# Patient Record
Sex: Male | Born: 1960 | Race: White | Hispanic: No | Marital: Married | Smoking: Never smoker
Health system: Southern US, Community
[De-identification: ages and names within clinical notes are randomized; demographics above are authoritative.]

## PROBLEM LIST (undated history)

## (undated) DIAGNOSIS — I10 Essential (primary) hypertension: Secondary | ICD-10-CM

## (undated) DIAGNOSIS — J189 Pneumonia, unspecified organism: Secondary | ICD-10-CM

## (undated) DIAGNOSIS — C44310 Basal cell carcinoma of skin of unspecified parts of face: Secondary | ICD-10-CM

## (undated) DIAGNOSIS — E785 Hyperlipidemia, unspecified: Secondary | ICD-10-CM

## (undated) DIAGNOSIS — K4090 Unilateral inguinal hernia, without obstruction or gangrene, not specified as recurrent: Secondary | ICD-10-CM

## (undated) DIAGNOSIS — C801 Malignant (primary) neoplasm, unspecified: Secondary | ICD-10-CM

## (undated) DIAGNOSIS — M21379 Foot drop, unspecified foot: Secondary | ICD-10-CM

## (undated) DIAGNOSIS — M1990 Unspecified osteoarthritis, unspecified site: Secondary | ICD-10-CM

## (undated) DIAGNOSIS — K219 Gastro-esophageal reflux disease without esophagitis: Secondary | ICD-10-CM

## (undated) HISTORY — PX: BACK SURGERY: SHX140

---

## 2001-03-05 ENCOUNTER — Emergency Department (HOSPITAL_COMMUNITY): Admission: EM | Admit: 2001-03-05 | Discharge: 2001-03-05 | Payer: Self-pay | Admitting: Emergency Medicine

## 2001-03-13 ENCOUNTER — Encounter: Payer: Self-pay | Admitting: Neurosurgery

## 2001-03-14 ENCOUNTER — Inpatient Hospital Stay (HOSPITAL_COMMUNITY): Admission: RE | Admit: 2001-03-14 | Discharge: 2001-03-16 | Payer: Self-pay | Admitting: Neurosurgery

## 2001-03-14 ENCOUNTER — Encounter: Payer: Self-pay | Admitting: Neurosurgery

## 2004-08-16 HISTORY — PX: OTHER SURGICAL HISTORY: SHX169

## 2006-06-06 ENCOUNTER — Ambulatory Visit: Payer: Self-pay | Admitting: Internal Medicine

## 2006-08-13 ENCOUNTER — Encounter: Admission: RE | Admit: 2006-08-13 | Discharge: 2006-08-13 | Payer: Self-pay | Admitting: Neurosurgery

## 2006-09-13 ENCOUNTER — Ambulatory Visit (HOSPITAL_COMMUNITY): Admission: RE | Admit: 2006-09-13 | Discharge: 2006-09-14 | Payer: Self-pay | Admitting: Neurosurgery

## 2006-11-11 ENCOUNTER — Ambulatory Visit (HOSPITAL_COMMUNITY): Admission: RE | Admit: 2006-11-11 | Discharge: 2006-11-11 | Payer: Self-pay | Admitting: Neurosurgery

## 2010-12-18 ENCOUNTER — Ambulatory Visit: Payer: Self-pay | Admitting: Internal Medicine

## 2011-08-13 ENCOUNTER — Ambulatory Visit: Payer: Self-pay | Admitting: Internal Medicine

## 2014-10-21 ENCOUNTER — Ambulatory Visit: Payer: Self-pay | Admitting: Registered Nurse

## 2015-04-03 ENCOUNTER — Encounter: Payer: Self-pay | Admitting: *Deleted

## 2015-04-04 ENCOUNTER — Encounter: Payer: Self-pay | Admitting: Anesthesiology

## 2015-04-04 ENCOUNTER — Ambulatory Visit
Admission: RE | Admit: 2015-04-04 | Discharge: 2015-04-04 | Disposition: A | Discharge status: Home / Self Care | Payer: Managed Care, Other (non HMO) | Source: Ambulatory Visit | Attending: Unknown Physician Specialty | Admitting: Unknown Physician Specialty

## 2015-04-04 ENCOUNTER — Ambulatory Visit: Payer: Managed Care, Other (non HMO) | Admitting: Anesthesiology

## 2015-04-04 ENCOUNTER — Encounter
Admission: RE | Disposition: A | Discharge status: Home / Self Care | Payer: Self-pay | Source: Ambulatory Visit | Attending: Unknown Physician Specialty

## 2015-04-04 DIAGNOSIS — K640 First degree hemorrhoids: Secondary | ICD-10-CM | POA: Insufficient documentation

## 2015-04-04 DIAGNOSIS — Z981 Arthrodesis status: Secondary | ICD-10-CM | POA: Insufficient documentation

## 2015-04-04 DIAGNOSIS — Z79899 Other long term (current) drug therapy: Secondary | ICD-10-CM | POA: Insufficient documentation

## 2015-04-04 DIAGNOSIS — D125 Benign neoplasm of sigmoid colon: Secondary | ICD-10-CM | POA: Insufficient documentation

## 2015-04-04 DIAGNOSIS — D128 Benign neoplasm of rectum: Secondary | ICD-10-CM | POA: Diagnosis not present

## 2015-04-04 DIAGNOSIS — Z1211 Encounter for screening for malignant neoplasm of colon: Secondary | ICD-10-CM | POA: Insufficient documentation

## 2015-04-04 DIAGNOSIS — D122 Benign neoplasm of ascending colon: Secondary | ICD-10-CM | POA: Diagnosis not present

## 2015-04-04 DIAGNOSIS — E785 Hyperlipidemia, unspecified: Secondary | ICD-10-CM | POA: Insufficient documentation

## 2015-04-04 DIAGNOSIS — M21371 Foot drop, right foot: Secondary | ICD-10-CM | POA: Insufficient documentation

## 2015-04-04 HISTORY — PX: COLONOSCOPY WITH PROPOFOL: SHX5780

## 2015-04-04 HISTORY — DX: Foot drop, unspecified foot: M21.379

## 2015-04-04 HISTORY — DX: Hyperlipidemia, unspecified: E78.5

## 2015-04-04 SURGERY — COLONOSCOPY WITH PROPOFOL
Anesthesia: General

## 2015-04-04 MED ORDER — PROPOFOL INFUSION 10 MG/ML OPTIME
INTRAVENOUS | Status: DC | PRN
  Administered 2015-04-04: 120 ug/kg/min via INTRAVENOUS

## 2015-04-04 MED ORDER — PROPOFOL 10 MG/ML IV BOLUS
INTRAVENOUS | Status: DC | PRN
  Administered 2015-04-04: 120 mg via INTRAVENOUS

## 2015-04-04 MED ORDER — GLYCOPYRROLATE 0.2 MG/ML IJ SOLN
INTRAMUSCULAR | Status: DC | PRN
  Administered 2015-04-04: 0.2 mg via INTRAVENOUS

## 2015-04-04 MED ORDER — SODIUM CHLORIDE 0.9 % IV SOLN
INTRAVENOUS | Status: DC
  Administered 2015-04-04: 1000 mL via INTRAVENOUS

## 2015-04-04 MED ORDER — LIDOCAINE HCL (CARDIAC) 20 MG/ML IV SOLN
INTRAVENOUS | Status: DC | PRN
  Administered 2015-04-04: 20 mg via INTRAVENOUS

## 2015-04-04 MED ORDER — FENTANYL CITRATE (PF) 100 MCG/2ML IJ SOLN
INTRAMUSCULAR | Status: DC | PRN
Start: 2015-04-04 — End: 2015-04-04
  Administered 2015-04-04: 50 ug via INTRAVENOUS

## 2015-04-04 MED ORDER — PHENYLEPHRINE HCL 10 MG/ML IJ SOLN
INTRAMUSCULAR | Status: DC | PRN
  Administered 2015-04-04: 50 ug via INTRAVENOUS

## 2015-04-04 MED ORDER — EPHEDRINE SULFATE 50 MG/ML IJ SOLN
INTRAMUSCULAR | Status: DC | PRN
  Administered 2015-04-04: 10 mg via INTRAVENOUS

## 2015-04-04 NOTE — Anesthesia Postprocedure Evaluation (Signed)
  Anesthesia Post-op Note  Patient: Trevor Neal  Procedure(s) Performed: Procedure(s): COLONOSCOPY WITH PROPOFOL (N/A)  Anesthesia type:General  Patient location: PACU  Post pain: Pain level controlled  Post assessment: Post-op Vital signs reviewed, Patient's Cardiovascular Status Stable, Respiratory Function Stable, Patent Airway and No signs of Nausea or vomiting  Post vital signs: Reviewed and stable  Last Vitals:  Filed Vitals:   04/04/15 1722  BP: 138/93  Pulse: 71  Temp:   Resp: 21    Level of consciousness: awake, alert  and patient cooperative  Complications: No apparent anesthesia complications

## 2015-04-04 NOTE — Anesthesia Preprocedure Evaluation (Signed)
Anesthesia Evaluation  Patient identified by MRN, date of birth, ID band Patient awake    Reviewed: Allergy & Precautions, H&P , NPO status , Patient's Chart, lab work & pertinent test results, reviewed documented beta blocker date and time   History of Anesthesia Complications Negative for: history of anesthetic complications  Airway Mallampati: II  TM Distance: >3 FB Neck ROM: full    Dental no notable dental hx. (+) Teeth Intact, Caps, Poor Dentition   Pulmonary neg pulmonary ROS,  breath sounds clear to auscultation  Pulmonary exam normal       Cardiovascular Exercise Tolerance: Good negative cardio ROS Normal cardiovascular examRhythm:regular Rate:Normal     Neuro/Psych negative neurological ROS  negative psych ROS   GI/Hepatic negative GI ROS, Neg liver ROS,   Endo/Other  negative endocrine ROS  Renal/GU negative Renal ROS  negative genitourinary   Musculoskeletal   Abdominal   Peds  Hematology negative hematology ROS (+)   Anesthesia Other Findings Past Medical History:   Foot drop                                                      Comment:chronic   Hyperlipidemia                                               Reproductive/Obstetrics negative OB ROS                             Anesthesia Physical Anesthesia Plan  ASA: I  Anesthesia Plan: General   Post-op Pain Management:    Induction:   Airway Management Planned:   Additional Equipment:   Intra-op Plan:   Post-operative Plan:   Informed Consent: I have reviewed the patients History and Physical, chart, labs and discussed the procedure including the risks, benefits and alternatives for the proposed anesthesia with the patient or authorized representative who has indicated his/her understanding and acceptance.   Dental Advisory Given  Plan Discussed with: Anesthesiologist, CRNA and Surgeon  Anesthesia Plan  Comments:         Anesthesia Quick Evaluation

## 2015-04-04 NOTE — Op Note (Signed)
[facility] Gastroenterology Patient Name: Trevor Neal Procedure Date: 04/04/2015 4:01 PM MRN: [id number] Account #: [id number] Date of Birth: Jul 25, 1961 Admit Type: Outpatient Age: 54 Room: [facility] ENDO ROOM 1 Gender: Male Note Status: Finalized Procedure:         Colonoscopy Indications:       Screening for colorectal malignant neoplasm Providers:         Manya Silvas, MD Referring MD:      Rusty Aus, MD (Referring MD) Medicines:         Propofol per Anesthesia Complications:     No immediate complications. Procedure:         Pre-Anesthesia Assessment:                    - After reviewing the risks and benefits, the patient was                     deemed in satisfactory condition to undergo the procedure.                    After obtaining informed consent, the colonoscope was                     passed under direct vision. Throughout the procedure, the                     patient's blood pressure, pulse, and oxygen saturations                     were monitored continuously. The Olympus PCF-H180AL                     colonoscope ( S#: [id number] ) was introduced through the                     anus and advanced to the the cecum, identified by                     appendiceal orifice and ileocecal valve. The colonoscopy                     was performed without difficulty. The patient tolerated                     the procedure well. The quality of the bowel preparation                     was excellent. Findings:      Three sessile polyps were found in the rectum, in the sigmoid colon and       in the ascending colon. The polyps were diminutive in size. These polyps       were removed with a jumbo cold forceps. Resection and retrieval were       complete.      Internal hemorrhoids were found during endoscopy. The hemorrhoids were       small and Grade I (internal hemorrhoids that do not prolapse). Impression:        - Three diminutive polyps in the  rectum, in the sigmoid                     colon and in the ascending colon. Resected and retrieved.                    -  Internal hemorrhoids. Recommendation:    - Await pathology results. Manya Silvas, MD 04/04/2015 4:47:22 PM This report has been signed electronically. Number of Addenda: 0 Note Initiated On: 04/04/2015 4:01 PM Scope Withdrawal Time: 0 hours 17 minutes 41 seconds  Total Procedure Duration: 0 hours 23 minutes 47 seconds       [facility]

## 2015-04-04 NOTE — Transfer of Care (Signed)
Immediate Anesthesia Transfer of Care Note  Patient: Trevor Neal  Procedure(s) Performed: Procedure(s): COLONOSCOPY WITH PROPOFOL (N/A)  Patient Location: PACU  Anesthesia Type:General  Level of Consciousness: sedated  Airway & Oxygen Therapy: Patient Spontanous Breathing and Patient connected to nasal cannula oxygen  Post-op Assessment: Report given to RN and Post -op Vital signs reviewed and stable  Post vital signs: Reviewed and stable  Last Vitals:  Filed Vitals:   04/04/15 1510  BP: 135/79  Pulse: 62  Temp: 36.4 C  Resp: 22    Complications: No apparent anesthesia complications

## 2015-04-05 NOTE — Progress Notes (Signed)
Non-identifying voicemail.  No message left.

## 2015-04-07 ENCOUNTER — Encounter: Payer: Self-pay | Admitting: Unknown Physician Specialty

## 2015-04-08 LAB — SURGICAL PATHOLOGY

## 2016-01-16 ENCOUNTER — Other Ambulatory Visit: Payer: Self-pay | Admitting: Internal Medicine

## 2016-01-16 DIAGNOSIS — I6523 Occlusion and stenosis of bilateral carotid arteries: Secondary | ICD-10-CM

## 2016-01-22 ENCOUNTER — Ambulatory Visit
Admission: RE | Admit: 2016-01-22 | Discharge: 2016-01-22 | Disposition: A | Discharge status: Home / Self Care | Payer: Managed Care, Other (non HMO) | Source: Ambulatory Visit | Attending: Internal Medicine | Admitting: Internal Medicine

## 2016-01-22 DIAGNOSIS — I6523 Occlusion and stenosis of bilateral carotid arteries: Secondary | ICD-10-CM | POA: Insufficient documentation

## 2016-03-11 ENCOUNTER — Encounter
Admission: RE | Admit: 2016-03-11 | Discharge: 2016-03-11 | Disposition: A | Discharge status: Home / Self Care | Payer: Managed Care, Other (non HMO) | Source: Ambulatory Visit | Attending: Surgery | Admitting: Surgery

## 2016-03-11 HISTORY — DX: Malignant (primary) neoplasm, unspecified: C80.1

## 2016-03-11 HISTORY — DX: Foot drop, unspecified foot: M21.379

## 2016-03-11 HISTORY — DX: Essential (primary) hypertension: I10

## 2016-03-11 NOTE — Patient Instructions (Signed)
  Your procedure is scheduled on: 03-19-16 (FRIDAY) Report to Same Day Surgery 2nd floor medical mall To find out your arrival time please call [phone] between [place] on 03-18-16 (THURSDAY)  Remember: Instructions that are not followed completely may result in serious medical risk, up to and including death, or upon the discretion of your surgeon and anesthesiologist your surgery may need to be rescheduled.    _x___ 1. Do not eat food or drink liquids after midnight. No gum chewing or hard candies.     __x__ 2. No Alcohol for 24 hours before or after surgery.   __x__3. No Smoking for 24 prior to surgery.   ____  4. Bring all medications with you on the day of surgery if instructed.    __x__ 5. Notify your doctor if there is any change in your medical condition     (cold, fever, infections).     Do not wear jewelry, make-up, hairpins, clips or nail polish.  Do not wear lotions, powders, or perfumes. You may wear deodorant.  Do not shave 48 hours prior to surgery. Men may shave face and neck.  Do not bring valuables to the hospital.    [facility] is not responsible for any belongings or valuables.               Contacts, dentures or bridgework may not be worn into surgery.  Leave your suitcase in the car. After surgery it may be brought to your room.  For patients admitted to the hospital, discharge time is determined by your treatment team.   Patients discharged the day of surgery will not be allowed to drive home.    Please read over the following fact sheets that you were given:   [facility] Preparing for Surgery and or MRSA Information   ____ Take these medicines the morning of surgery with A SIP OF WATER:    1. NONE  2.  3.  4.  5.  6.  ____ Fleet Enema (as directed)   _x___ Use CHG Soap or sage wipes as directed on instruction sheet   ____ Use inhalers on the day of surgery and bring to hospital day of surgery  ____ Stop metformin 2 days prior to  surgery    ____ Take 1/2 of usual insulin dose the night before surgery and none on the morning of surgery.   _X___ Stop aspirin or coumadin, or plavix-STOP ASPRIN NOW  _x__ Stop Anti-inflammatories such as Advil, Aleve, Ibuprofen, Motrin, Naproxen,          Naprosyn, Goodies powders or aspirin products. Ok to take Tylenol.   ____ Stop supplements until after surgery.    ____ Bring C-Pap to the hospital.

## 2016-03-16 NOTE — Pre-Procedure Instructions (Signed)
NM myocardial perfusion SPECT multiple (stress and rest)01/30/2016 [place] Result Impression  Negative ETT with no arrhythmia or ischemia.LV function normal.Low  probability for ischemia.  Result Narrative  CARDIOLOGY DEPARTMENT [facility] [address] [place], J989805  Procedure: Exercise Myocardial Perfusion Imaging ONE day procedure  Indication: Chest pain at rest, unspecified Plan: NM myocardial perfusion SPECT multiple (stress  and rest), ECG stress test only  Ordering Physician:   Dr. Bartholome Bill   Clinical History: 55 y.o. year old male Vitals: Height: 72 inWeight: 216 lb Cardiac risk factors include:  CAS, Hyperlipidemia, HTN and Family Hx CAD    Procedure: The patient performed treadmill exercise using a Bruce protocol for 10:00  minutes. The exercise test was stopped due to fatigue.Blood pressure  response was normal.   Rest HR: 61bpm Rest BP: 136/35mmHg Max HR: 148bpm Max BP: 198/32mmHg Mets:13.40 % MAX HR: 88%  Stress Test Administered by: Oswald Hillock, CMA  ECG Interpretation: Rest [id number] sinus rhythm, none Stress [id number] tachycardia, nonspecific ST-T wave changes Recovery [id number] sinus rhythm ECG Interpretation:negative, nondiagnostic changes.   Administrations This Visit  technetium Tc37m sestamibi (CARDIOLITE) injection [id number] millicurie  Admin Date Action Dose Route Administered By      [id number] Given [id number] millicurie Intravenous Scott N Goard, CNMT      technetium Tc84m sestamibi (CARDIOLITE) injection [id number] millicurie  Admin Date Action Dose Route Administered By      [id number] Given [id number] millicurie Intravenous Ane Payment, CNMT        Gated post-stress perfusion imaging was performed 30 minutes after stress.  Rest images were performed 30 minutes after injection.  Gated LV  Analysis:  TID:0.96  LVEF= 61%  FINDINGS: Regional wall motion:reveals normal myocardial thickening and wall  motion. The overall quality of the study is good. Artifacts noted: no Left ventricular cavity: normal.  Perfusion Analysis:SPECT images demonstrate homogeneous tracer  distribution throughout the myocardium.

## 2016-03-18 ENCOUNTER — Encounter
Admission: RE | Admit: 2016-03-18 | Discharge: 2016-03-18 | Disposition: A | Discharge status: Home / Self Care | Payer: Managed Care, Other (non HMO) | Source: Ambulatory Visit | Attending: Surgery | Admitting: Surgery

## 2016-03-18 DIAGNOSIS — I10 Essential (primary) hypertension: Secondary | ICD-10-CM | POA: Diagnosis not present

## 2016-03-18 DIAGNOSIS — M21379 Foot drop, unspecified foot: Secondary | ICD-10-CM | POA: Diagnosis not present

## 2016-03-18 DIAGNOSIS — Z981 Arthrodesis status: Secondary | ICD-10-CM | POA: Diagnosis not present

## 2016-03-18 DIAGNOSIS — E785 Hyperlipidemia, unspecified: Secondary | ICD-10-CM | POA: Diagnosis not present

## 2016-03-18 DIAGNOSIS — Z7982 Long term (current) use of aspirin: Secondary | ICD-10-CM | POA: Diagnosis not present

## 2016-03-18 DIAGNOSIS — Z8249 Family history of ischemic heart disease and other diseases of the circulatory system: Secondary | ICD-10-CM | POA: Diagnosis not present

## 2016-03-18 DIAGNOSIS — Z79899 Other long term (current) drug therapy: Secondary | ICD-10-CM | POA: Diagnosis not present

## 2016-03-18 DIAGNOSIS — K439 Ventral hernia without obstruction or gangrene: Secondary | ICD-10-CM | POA: Diagnosis not present

## 2016-03-18 LAB — POTASSIUM: POTASSIUM: 3.8 mmol/L (ref 3.5–5.1)

## 2016-03-18 NOTE — Pre-Procedure Instructions (Signed)
Telephone Encounter - Nathanial Millman, RN - 01/23/2016 10:30 AM EDT I spoke with patient, notified him of carotid dopper results, he states understanding of info, will follow this annually and he will continue taking his crestor.   Back to top of Miscellaneous Notes Telephone Encounter - Nathanial Millman, RN - 01/23/2016 8:50 AM EDT [facility] for patient.   Back to top of Miscellaneous Notes Telephone Encounter - Yevonne Pax, MD - 01/22/2016 6:03 PM EDT Carotid Doppler shows mild to moderate plaque in the right carotid, less than 50% stenosis, not affecting his symptoms but certainly shows that he needs the cholesterol medication, will follow annually

## 2016-03-18 NOTE — Pre-Procedure Instructions (Signed)
Trevor Levans, MD - 01/28/2016 3:45 PM EDT Formatting of this note may be different from the original.   Chief Complaint: Chief Complaint  Patient presents with  . Establish Care  per klein abnormal ETT  Date of Service: 01/28/2016 Date of Birth: Mar 05, 1961 PCP: Rusty Aus, MD  History of Present Illness: Trevor Neal is a 55 y.o.male patient who presents as a urgent visit after undergoing an ETT which was felt to be abnormal. Patient has a history of hypertension and hyperlipidemia. He has occasional chest discomfort. He is on Crestor for hyperlipidemia and is on enteric-coated aspirin daily. He has occasional umbilical pain which causes him some nausea. He also has some dizziness and lightheadedness with activity. He underwent an ETT which was felt to show ischemia. He was referred to our office for evaluation. Patient has only minimal episodes of chest pain. He remains quite active without difficulty. Risk factors include hyperlipidemia.  Past Medical and Surgical History  Past Medical History Past Medical History:  Diagnosis Date  . Essential hypertension 04/17/2015  . Footdrop  chronic  . Hyperlipidemia, unspecified   Past Surgical History He has a past surgical history that includes Spine surgery; Spine surgery (2008); Spinal fusion (2012); foot drop (2006); arthrodesis anterior cervicle spine (N/A, 06/22/2013); arthrodesis anterior cervicle spine (N/A, 06/22/2013); instrumentation anterior spine 2 to 3 vertebral segments (N/A, 06/22/2013); insertion structural bone allograft for spine surgery (N/A, 06/22/2013); Colonoscopy (04/04/2015); Back surgery; and Vasectomy.   Medications and Allergies  Current Medications  Current Outpatient Prescriptions  Medication Sig Dispense Refill  . fluticasone (FLONASE) 50 mcg/actuation nasal spray Place 2 sprays into both nostrils once daily as needed for Rhinitis.  . rosuvastatin (CRESTOR) 10 MG tablet Take 5 mg by mouth once daily.  Marland Kitchen  aspirin 81 MG EC tablet Take 1 tablet (81 mg total) by mouth once daily. 30 tablet 11   No current facility-administered medications for this visit.   Allergies: Review of patient's allergies indicates no known allergies.  Social and Family History  Social History reports that he has never smoked. He has never used smokeless tobacco. He reports that he drinks about 3.6 oz of alcohol per week He reports that he does not use illicit drugs.  Family History Family History  Problem Relation Age of Onset  . Heart attack Father  . Heart attack Other  Uncle   Review of Systems  Review of Systems  Constitutional: Negative for chills, diaphoresis, fever, malaise/fatigue and weight loss.  HENT: Negative for congestion, ear discharge, hearing loss and tinnitus.  Eyes: Negative for blurred vision.  Respiratory: Negative for cough, hemoptysis, sputum production, shortness of breath and wheezing.  Cardiovascular: Positive for chest pain. Negative for palpitations, orthopnea, claudication, leg swelling and PND.  Gastrointestinal: Negative for abdominal pain, blood in stool, constipation, diarrhea, heartburn, melena, nausea and vomiting.  Genitourinary: Negative for dysuria, frequency, hematuria and urgency.  Musculoskeletal: Negative for back pain, falls, joint pain and myalgias.  Skin: Negative for itching and rash.  Neurological: Positive for dizziness. Negative for tingling, focal weakness, loss of consciousness, weakness and headaches.  Endo/Heme/Allergies: Negative for polydipsia. Does not bruise/bleed easily.  Psychiatric/Behavioral: Negative for depression, memory loss and substance abuse. The patient is not nervous/anxious.   Physical Examination   Vitals:BP 130/72  Pulse 84  Resp 12  Ht 182.9 cm (6')  Wt 98 kg (216 lb)  BMI 29.29 kg/m2 Ht:182.9 cm (6') Wt:98 kg (216 lb) [id number] surface area is 2.23 meters squared. Body mass  index is 29.29 kg/(m^2).  Wt Readings from Last 3  Encounters:  01/28/16 98 kg (216 lb)  01/16/16 99.3 kg (219 lb)  08/05/15 96.2 kg (212 lb)   BP Readings from Last 3 Encounters:  01/28/16 130/72  01/16/16 148/82  08/05/15 131/81   General appearance appears in no acute distress  Head Mouth and Eye exam Normocephalic, without obvious abnormality, atraumatic Dentition is good Eyes appear anicteric   Neck exam Thyroid: normal  Nodes: no obvious adenopathy  LUNGS Breath Sounds: Normal Percussion: Normal  CARDIOVASCULAR JVP CV wave: no HJR: no Elevation at 90 degrees: None Carotid Pulse: normal pulsation bilaterally Bruit: None Apex: apical impulse normal  Auscultation Rhythm: normal sinus rhythm S1: normal S2: normal Clicks: no Rub: no Murmurs: no murmurs  Gallop: None ABDOMEN Liver enlargement: no Pulsatile aorta: no Ascites: no Bruits: no  EXTREMITIES Clubbing: no Edema: trace to 1+ bilateral pedal edema Pulses: peripheral pulses symmetrical Femoral Bruits: no Amputation: no SKIN Rash: no Cyanosis: no Embolic phemonenon: no Bruising: no NEURO Alert and Oriented to person, place and time: yes Non focal: yes  PSYCH: Pt appears to have normal affect  LABS REVIEWED Last 3 CBC results: Lab Results  Component Value Date  WBC 8.2 01/15/2016  WBC 7.3 01/29/2015  WBC 6.7 06/19/2013   Lab Results  Component Value Date  HGB 14.2 01/15/2016  HGB 14.7 01/29/2015  HGB 14.9 06/19/2013   Lab Results  Component Value Date  HCT 42.0 01/15/2016  HCT 43.3 01/29/2015  HCT 0.43 06/19/2013   Lab Results  Component Value Date  PLT 245 01/15/2016  PLT 267 01/29/2015  PLT 260 06/19/2013   Lab Results  Component Value Date  CREATININE 1.1 01/15/2016  BUN 19 01/15/2016  NA 141 01/15/2016  K 4.0 01/15/2016  CL 106 01/15/2016  CO2 27.1 01/15/2016   No results found for: HGBA1C  Lab Results  Component Value Date  HDL 30.9 01/15/2016  HDL 35.0 01/29/2015   Lab Results  Component Value  Date  LDLCALC 56 01/15/2016  LDLCALC 75 01/29/2015   Lab Results  Component Value Date  TRIG 231 (H) 01/15/2016  TRIG 123 01/29/2015   Lab Results  Component Value Date  ALT 29 01/15/2016  AST 19 01/15/2016  ALKPHOS 75 01/15/2016   Lab Results  Component Value Date  TSH 1.647 01/15/2016   Diagnostic Studies Reviewed:  EKG EKG demonstrated normal sinus rhythm, nonspecific ST and T waves changes.  Assessment and Plan   55 y.o. male with  ICD-10-CM ICD-9-CM  1. Chest pain at rest, unspecified-etiology unclear. ETT was not significantly abnormal somewhat equivocal. We will proceed with an ETT sestamibi to evaluate for evidence of possible ischemia. Will continue with current regimen until this is complete R07.9 786.50 NM myocardial perfusion SPECT multiple (stress and rest)  ECG stress test only  2. Carotid stenosis, asymptomatic, right I65.21 433.10  3. Essential hypertension I10 401.9  4. Hyperlipidemia, mixed E78.2 272.2   Return in about 2 weeks (around 02/11/2016).  These notes generated with voice recognition software. I apologize for typographical errors.  Trevor Levans, MD       Plan of Treatment - as of this encounter  Upcoming Encounters Upcoming Encounters  Date Type Specialty Care Team Description  04/01/2016 Post Op General Surgery Delrae Alfred., MD  [place]  [place], [place] [address]  [phone]  [phone] (Fax)    01/17/2017 Office Visit Internal Medicine Yevonne Pax, MD  [place]  [facility] [place]  [place],  [address]  [phone]  [phone] (Fax)

## 2016-03-19 ENCOUNTER — Ambulatory Visit: Payer: Managed Care, Other (non HMO) | Admitting: Certified Registered"

## 2016-03-19 ENCOUNTER — Encounter: Payer: Self-pay | Admitting: *Deleted

## 2016-03-19 ENCOUNTER — Ambulatory Visit
Admission: RE | Admit: 2016-03-19 | Discharge: 2016-03-19 | Disposition: A | Discharge status: Home / Self Care | Payer: Managed Care, Other (non HMO) | Source: Ambulatory Visit | Attending: Surgery | Admitting: Surgery

## 2016-03-19 ENCOUNTER — Encounter
Admission: RE | Disposition: A | Discharge status: Home / Self Care | Payer: Self-pay | Source: Ambulatory Visit | Attending: Surgery

## 2016-03-19 DIAGNOSIS — Z7982 Long term (current) use of aspirin: Secondary | ICD-10-CM | POA: Insufficient documentation

## 2016-03-19 DIAGNOSIS — E785 Hyperlipidemia, unspecified: Secondary | ICD-10-CM | POA: Insufficient documentation

## 2016-03-19 DIAGNOSIS — M21379 Foot drop, unspecified foot: Secondary | ICD-10-CM | POA: Insufficient documentation

## 2016-03-19 DIAGNOSIS — Z981 Arthrodesis status: Secondary | ICD-10-CM | POA: Insufficient documentation

## 2016-03-19 DIAGNOSIS — Z79899 Other long term (current) drug therapy: Secondary | ICD-10-CM | POA: Insufficient documentation

## 2016-03-19 DIAGNOSIS — Z8249 Family history of ischemic heart disease and other diseases of the circulatory system: Secondary | ICD-10-CM | POA: Insufficient documentation

## 2016-03-19 DIAGNOSIS — K439 Ventral hernia without obstruction or gangrene: Secondary | ICD-10-CM | POA: Insufficient documentation

## 2016-03-19 DIAGNOSIS — I10 Essential (primary) hypertension: Secondary | ICD-10-CM | POA: Insufficient documentation

## 2016-03-19 HISTORY — PX: VENTRAL HERNIA REPAIR: SHX424

## 2016-03-19 SURGERY — REPAIR, HERNIA, VENTRAL
Anesthesia: General | Wound class: Clean

## 2016-03-19 MED ORDER — BUPIVACAINE-EPINEPHRINE (PF) 0.5% -1:200000 IJ SOLN
INTRAMUSCULAR | Status: DC | PRN
  Administered 2016-03-19: 10 mL via PERINEURAL

## 2016-03-19 MED ORDER — FENTANYL CITRATE (PF) 100 MCG/2ML IJ SOLN
INTRAMUSCULAR | Status: DC | PRN
  Administered 2016-03-19: 100 ug via INTRAVENOUS

## 2016-03-19 MED ORDER — HYDROCODONE-ACETAMINOPHEN 5-325 MG PO TABS
1.0000 | ORAL_TABLET | ORAL | Status: DC | PRN
  Administered 2016-03-19: 1 via ORAL

## 2016-03-19 MED ORDER — BUPIVACAINE-EPINEPHRINE (PF) 0.5% -1:200000 IJ SOLN
INTRAMUSCULAR | Status: AC
  Filled 2016-03-19: qty 30

## 2016-03-19 MED ORDER — CEFAZOLIN SODIUM-DEXTROSE 2-4 GM/100ML-% IV SOLN
INTRAVENOUS | Status: AC
  Administered 2016-03-19: 2 g via INTRAVENOUS
  Filled 2016-03-19: qty 100

## 2016-03-19 MED ORDER — HYDROCODONE-ACETAMINOPHEN 5-325 MG PO TABS
ORAL_TABLET | ORAL | Status: AC
  Administered 2016-03-19: 1 via ORAL
  Filled 2016-03-19: qty 1

## 2016-03-19 MED ORDER — CEFAZOLIN SODIUM-DEXTROSE 2-4 GM/100ML-% IV SOLN
2.0000 g | Freq: Once | INTRAVENOUS | Status: AC
  Administered 2016-03-19: 2 g via INTRAVENOUS

## 2016-03-19 MED ORDER — MIDAZOLAM HCL 2 MG/2ML IJ SOLN
INTRAMUSCULAR | Status: DC | PRN
  Administered 2016-03-19: 2 mg via INTRAVENOUS

## 2016-03-19 MED ORDER — ONDANSETRON HCL 4 MG/2ML IJ SOLN
INTRAMUSCULAR | Status: DC | PRN
  Administered 2016-03-19: 4 mg via INTRAVENOUS

## 2016-03-19 MED ORDER — HYDROCODONE-ACETAMINOPHEN 5-325 MG PO TABS: 1.0000 | ORAL_TABLET | ORAL | 0 refills | Status: DC | PRN

## 2016-03-19 MED ORDER — ROCURONIUM BROMIDE 100 MG/10ML IV SOLN
INTRAVENOUS | Status: DC | PRN
  Administered 2016-03-19 (×2): 10 mg via INTRAVENOUS
  Administered 2016-03-19: 50 mg via INTRAVENOUS
  Administered 2016-03-19: 10 mg via INTRAVENOUS

## 2016-03-19 MED ORDER — FAMOTIDINE 20 MG PO TABS
20.0000 mg | ORAL_TABLET | Freq: Once | ORAL | Status: AC
  Administered 2016-03-19: 20 mg via ORAL

## 2016-03-19 MED ORDER — NEOSTIGMINE METHYLSULFATE 10 MG/10ML IV SOLN
INTRAVENOUS | Status: DC | PRN
  Administered 2016-03-19: 4 mg via INTRAVENOUS

## 2016-03-19 MED ORDER — FAMOTIDINE 20 MG PO TABS
ORAL_TABLET | ORAL | Status: AC
  Administered 2016-03-19: 20 mg via ORAL
  Filled 2016-03-19: qty 1

## 2016-03-19 MED ORDER — LIDOCAINE HCL (CARDIAC) 20 MG/ML IV SOLN
INTRAVENOUS | Status: DC | PRN
  Administered 2016-03-19: 100 mg via INTRAVENOUS

## 2016-03-19 MED ORDER — LACTATED RINGERS IV SOLN: INTRAVENOUS | Status: DC

## 2016-03-19 MED ORDER — PROPOFOL 10 MG/ML IV BOLUS
INTRAVENOUS | Status: DC | PRN
  Administered 2016-03-19: 180 mg via INTRAVENOUS

## 2016-03-19 MED ORDER — GLYCOPYRROLATE 0.2 MG/ML IJ SOLN
INTRAMUSCULAR | Status: DC | PRN
  Administered 2016-03-19: .6 mg via INTRAVENOUS

## 2016-03-19 MED ORDER — LACTATED RINGERS IV SOLN
INTRAVENOUS | Status: DC | PRN
  Administered 2016-03-19: 08:00:00 via INTRAVENOUS

## 2016-03-19 MED ORDER — FENTANYL CITRATE (PF) 100 MCG/2ML IJ SOLN: 25.0000 ug | INTRAMUSCULAR | Status: DC | PRN

## 2016-03-19 MED ORDER — ONDANSETRON HCL 4 MG/2ML IJ SOLN: 4.0000 mg | Freq: Once | INTRAMUSCULAR | Status: DC | PRN

## 2016-03-19 SURGICAL SUPPLY — 26 items
BLADE CLIPPER SURG (BLADE) ×2 IMPLANT
CANISTER SUCT 1200ML W/VALVE (MISCELLANEOUS) ×3 IMPLANT
CHLORAPREP W/TINT 26ML (MISCELLANEOUS) ×3 IMPLANT
DRAPE LAPAROTOMY 100X77 ABD (DRAPES) ×3 IMPLANT
ELECT REM PT RETURN 9FT ADLT (ELECTROSURGICAL) ×3
ELECTRODE REM PT RTRN 9FT ADLT (ELECTROSURGICAL) ×1 IMPLANT
GAUZE SPONGE 4X4 12PLY STRL (GAUZE/BANDAGES/DRESSINGS) IMPLANT
GLOVE BIO SURGEON STRL SZ7.5 (GLOVE) ×9 IMPLANT
GOWN STRL REUS W/ TWL LRG LVL3 (GOWN DISPOSABLE) ×3 IMPLANT
GOWN STRL REUS W/TWL LRG LVL3 (GOWN DISPOSABLE) ×12
KIT RM TURNOVER STRD PROC AR (KITS) ×3 IMPLANT
LABEL OR SOLS (LABEL) ×3 IMPLANT
LIQUID BAND (GAUZE/BANDAGES/DRESSINGS) ×3 IMPLANT
MESH SYNTHETIC 4X6 SOFT BARD (Mesh General) IMPLANT
MESH SYNTHETIC SOFT BARD 4X6 (Mesh General) ×2 IMPLANT
NDL HYPO 25X1 1.5 SAFETY (NEEDLE) ×1 IMPLANT
NEEDLE HYPO 25X1 1.5 SAFETY (NEEDLE) ×3 IMPLANT
NS IRRIG 500ML POUR BTL (IV SOLUTION) ×3 IMPLANT
PACK BASIN MINOR ARMC (MISCELLANEOUS) ×3 IMPLANT
STAPLER SKIN PROX 35W (STAPLE) IMPLANT
SUT CHROMIC 3 0 SH 27 (SUTURE) ×3 IMPLANT
SUT MNCRL 4-0 (SUTURE) ×3
SUT MNCRL 4-0 27XMFL (SUTURE) ×1
SUT SURGILON 0 30 BLK (SUTURE) ×7 IMPLANT
SUTURE MNCRL 4-0 27XMF (SUTURE) ×1 IMPLANT
SYRINGE 10CC LL (SYRINGE) ×3 IMPLANT

## 2016-03-19 NOTE — H&P (Signed)
  He reports no change in condition since office exam.  Labs noted  Discussed plan for ventral hernia  repair

## 2016-03-19 NOTE — Anesthesia Preprocedure Evaluation (Signed)
Anesthesia Evaluation  Patient identified by MRN, date of birth, ID band Patient awake    Reviewed: Allergy & Precautions, NPO status , Patient's Chart, lab work & pertinent test results  Airway Mallampati: II       Dental  (+) Teeth Intact   Pulmonary neg pulmonary ROS,    breath sounds clear to auscultation       Cardiovascular Exercise Tolerance: Good hypertension, Pt. on medications  Rhythm:Regular     Neuro/Psych negative neurological ROS  negative psych ROS   GI/Hepatic negative GI ROS, Neg liver ROS,   Endo/Other  negative endocrine ROS  Renal/GU negative Renal ROS     Musculoskeletal   Abdominal Normal abdominal exam  (+)   Peds  Hematology   Anesthesia Other Findings   Reproductive/Obstetrics                             Anesthesia Physical Anesthesia Plan  ASA: II  Anesthesia Plan: General   Post-op Pain Management:    Induction: Intravenous  Airway Management Planned: Oral ETT  Additional Equipment:   Intra-op Plan:   Post-operative Plan: Extubation in OR  Informed Consent: I have reviewed the patients History and Physical, chart, labs and discussed the procedure including the risks, benefits and alternatives for the proposed anesthesia with the patient or authorized representative who has indicated his/her understanding and acceptance.     Plan Discussed with: CRNA  Anesthesia Plan Comments:         Anesthesia Quick Evaluation

## 2016-03-19 NOTE — Transfer of Care (Signed)
Immediate Anesthesia Transfer of Care Note  Patient: Trevor Neal  Procedure(s) Performed: Procedure(s): HERNIA REPAIR VENTRAL ADULT (N/A)  Patient Location: PACU  Anesthesia Type:General  Level of Consciousness: sedated and responds to stimulation  Airway & Oxygen Therapy: Patient Spontanous Breathing and Patient connected to face mask oxygen  Post-op Assessment: Report given to RN and Post -op Vital signs reviewed and stable  Post vital signs: Reviewed and stable  Last Vitals:  Vitals:   03/19/16 1015 03/19/16 1019  BP: (!) 168/99 (!) 168/99  Pulse: 84 77  Resp: (!) 37 15  Temp: 36.8 C     Last Pain:  Vitals:   03/19/16 0823  TempSrc: Oral         Complications: No apparent anesthesia complications

## 2016-03-19 NOTE — Anesthesia Procedure Notes (Signed)
Procedure Name: Intubation Performed by: Lance Muss Pre-anesthesia Checklist: Patient identified, Patient being monitored, Timeout performed, Emergency Drugs available and Suction available Patient Re-evaluated:Patient Re-evaluated prior to inductionOxygen Delivery Method: Circle system utilized Preoxygenation: Pre-oxygenation with 100% oxygen Intubation Type: IV induction Ventilation: Mask ventilation without difficulty and Oral airway inserted - appropriate to patient size Laryngoscope Size: Mac and 4 Grade View: Grade III Tube type: Oral Tube size: 7.5 mm Number of attempts: 1 Airway Equipment and Method: Stylet Placement Confirmation: ETT inserted through vocal cords under direct vision,  positive ETCO2 and breath sounds checked- equal and bilateral Secured at: 23 cm Tube secured with: Tape Dental Injury: Teeth and Oropharynx as per pre-operative assessment  Difficulty Due To: Difficult Airway- due to anterior larynx

## 2016-03-19 NOTE — Op Note (Signed)
OPERATIVE REPORT  PREOPERATIVE  DIAGNOSIS: . Ventral hernia  POSTOPERATIVE DIAGNOSIS: . Ventral hernia  PROCEDURE: . Ventral hernia repair  ANESTHESIA:  General  SURGEON: Rochel Brome  MD   INDICATIONS: . He reports a history of bulging and mild discomfort in the epigastrium. A ventral hernia was demonstrated on physical exam and repair was recommended for definitive treatment.  With the patient on the operating table in the supine position under general anesthesia the abdomen was clipped and prepared with ChloraPrep draped in a sterile manner. A longitudinally incision was made in the epigastrium 5-8 cm above the umbilicus and during the course of dissection was lengthened. Dissection was carried down through subcutaneous tissues. Several small bleeding points were cauterized. There was a finding of herniated properitoneal fat which was approximately 3 cm in dimension this was dissected free from surrounding structures and demonstrated the fascial ring defect. It was necessary to enlarge the fascial ring defect on the patient's left side and then the hernia could be reduced. Bard soft mesh was cut to create an oval shape of 1.5 x 2 cm in dimension and was placed into the properitoneal plane oriented longitudinally and sutured to the overlying fascia with through and through 0 Surgilon sutures. The repair was carried out with a transversely oriented suture line of interrupted 0 Surgilon figure-of-eight sutures incorporating each suture into the mesh. The subcutaneous tissues were infiltrated with half percent Sensorcaine with epinephrine. The superficial fascia was closed with 3-0 chromic. The skin was closed with running 4-0 Monocryl subcuticular suture and LiquiBand. The patient tolerated surgery satisfactorily and was prepared for transfer to the recovery room  [facility].D.

## 2016-03-19 NOTE — Anesthesia Postprocedure Evaluation (Signed)
Anesthesia Post Note  Patient: Trevor Neal  Procedure(s) Performed: Procedure(s) (LRB): HERNIA REPAIR VENTRAL ADULT (N/A)  Patient location during evaluation: PACU Anesthesia Type: General Level of consciousness: awake Pain management: pain level controlled Vital Signs Assessment: post-procedure vital signs reviewed and stable Respiratory status: spontaneous breathing Cardiovascular status: stable Anesthetic complications: no    Last Vitals:  Vitals:   03/19/16 1019 03/19/16 1020  BP: (!) 168/99 (!) 146/86  Pulse: 77 79  Resp: 15 18  Temp:      Last Pain:  Vitals:   03/19/16 1015  TempSrc:   PainSc: Asleep                 VAN STAVEREN,Darneisha Windhorst

## 2016-03-19 NOTE — Discharge Instructions (Addendum)
Take Tylenol or Norco if needed for pain.  Should not drive or do anything dangerous when taking Norco.  May resume aspirin on Sunday.  Avoid straining and heavy lifting.  May shower and blot dry.  AMBULATORY SURGERY  DISCHARGE INSTRUCTIONS   1) The drugs that you were given will stay in your system until tomorrow so for the next 24 hours you should not:  A) Drive an automobile B) Make any legal decisions C) Drink any alcoholic beverage   2) You may resume regular meals tomorrow.  Today it is better to start with liquids and gradually work up to solid foods.  You may eat anything you prefer, but it is better to start with liquids, then soup and crackers, and gradually work up to solid foods.   3) Please notify your doctor immediately if you have any unusual bleeding, trouble breathing, redness and pain at the surgery site, drainage, fever, or pain not relieved by medication.    4) Additional Instructions:        Please contact your physician with any problems or Same Day Surgery at [phone], Monday through Friday 6 am to 4 pm, or [place] at [facility] number at [phone].

## 2016-04-09 ENCOUNTER — Encounter: Payer: Self-pay | Admitting: Surgery

## 2017-02-25 ENCOUNTER — Other Ambulatory Visit: Payer: Self-pay | Admitting: Internal Medicine

## 2017-02-25 DIAGNOSIS — I6521 Occlusion and stenosis of right carotid artery: Secondary | ICD-10-CM

## 2017-03-07 ENCOUNTER — Ambulatory Visit
Admission: RE | Admit: 2017-03-07 | Discharge: 2017-03-07 | Disposition: A | Discharge status: Home / Self Care | Payer: Commercial Managed Care - PPO | Source: Ambulatory Visit | Attending: Internal Medicine | Admitting: Internal Medicine

## 2017-03-07 DIAGNOSIS — I6521 Occlusion and stenosis of right carotid artery: Secondary | ICD-10-CM | POA: Diagnosis not present

## 2017-03-07 DIAGNOSIS — I6523 Occlusion and stenosis of bilateral carotid arteries: Secondary | ICD-10-CM | POA: Diagnosis not present

## 2020-08-16 HISTORY — PX: JOINT REPLACEMENT: SHX530

## 2020-10-06 ENCOUNTER — Other Ambulatory Visit: Payer: Self-pay | Admitting: Orthopedic Surgery

## 2020-10-06 DIAGNOSIS — M19011 Primary osteoarthritis, right shoulder: Secondary | ICD-10-CM

## 2020-10-17 ENCOUNTER — Ambulatory Visit
Admission: RE | Admit: 2020-10-17 | Discharge: 2020-10-17 | Disposition: A | Discharge status: Home / Self Care | Payer: Commercial Managed Care - PPO | Source: Ambulatory Visit | Attending: Orthopedic Surgery | Admitting: Orthopedic Surgery

## 2020-10-17 ENCOUNTER — Other Ambulatory Visit: Payer: Self-pay

## 2020-10-17 ENCOUNTER — Ambulatory Visit: Payer: Commercial Managed Care - PPO

## 2020-10-17 DIAGNOSIS — M19011 Primary osteoarthritis, right shoulder: Secondary | ICD-10-CM | POA: Insufficient documentation

## 2021-04-20 IMAGING — MR MR SHOULDER*R* W/O CM
5 series · 31 of 40 positions shown · non-contrast
Comparison: None.

CLINICAL DATA: Chronic right shoulder pain and weakness.

EXAM:
MRI OF THE RIGHT SHOULDER WITHOUT CONTRAST
TECHNIQUE: Multiplanar, multisequence MR imaging of the shoulder was performed.
No intravenous contrast was administered.

[Series 5: T2 fat-sat · axial · right · 4.0mm · 0.44mm/px · z∈[-43,+76]mm · 8 of 26 slices shown (1 of 3)]
[im 1/26]
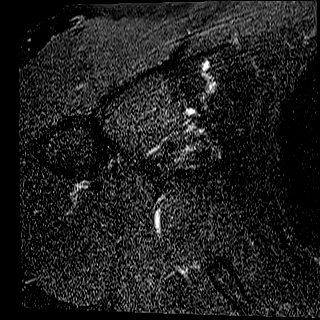
[im 3/26]
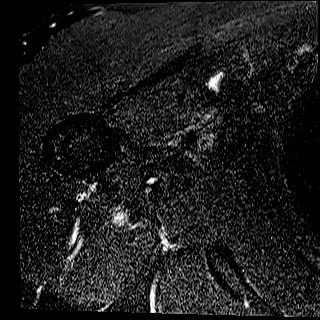
[im 9/26]
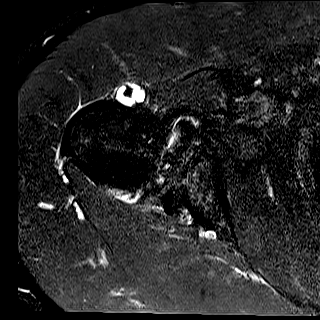
[im 12/26]
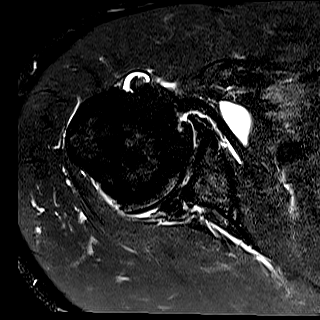
[im 14/26]
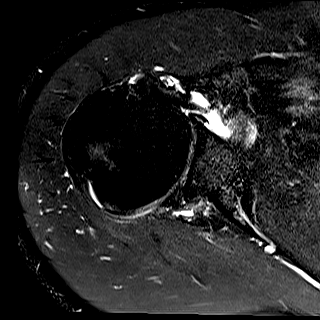
[im 17/26]
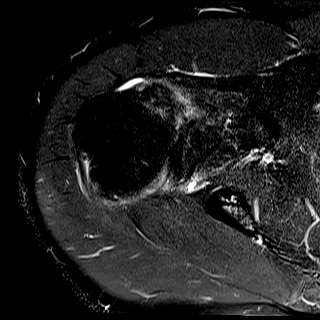
[im 23/26]
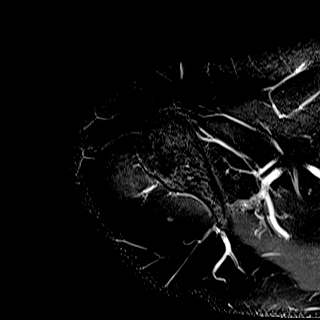
[im 26/26]
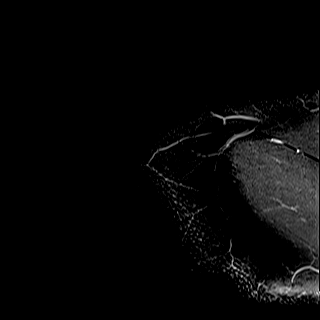

[Series 6: T1 · oblique · right · 4.0mm · 0.36mm/px · 1 of 24 slices shown]
[im 1/24]
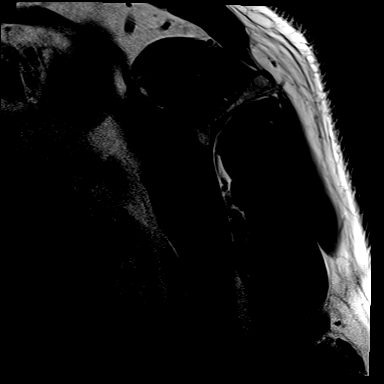

[Series 7: T2 fat-sat · oblique · right · 4.0mm · 0.24mm/px · 8 of 24 slices shown (2 of 3)]
[im 1/24]
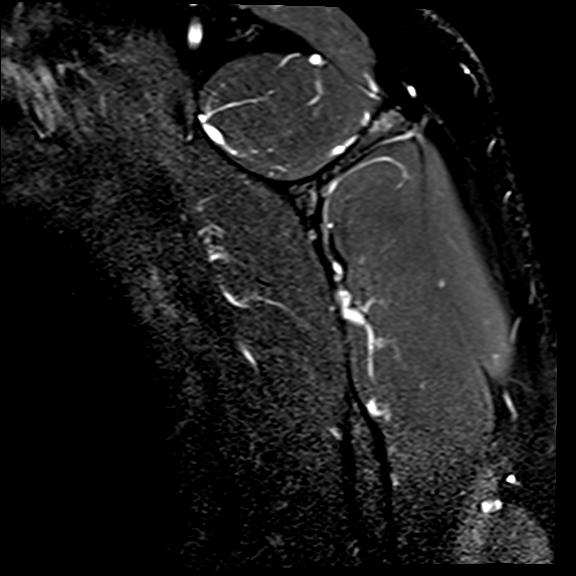
[im 4/24]
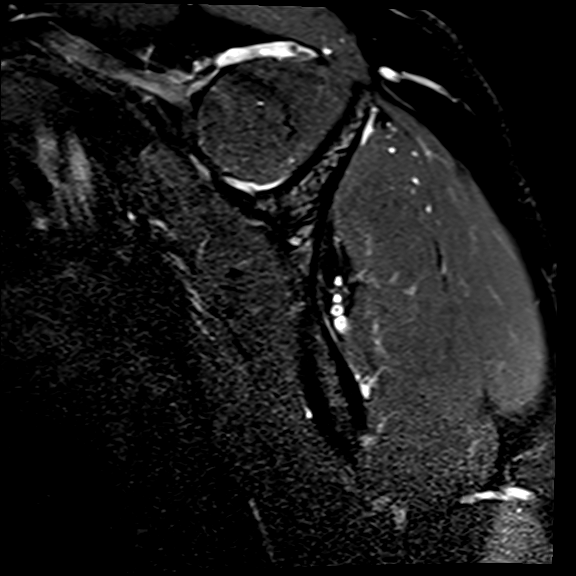
[im 7/24]
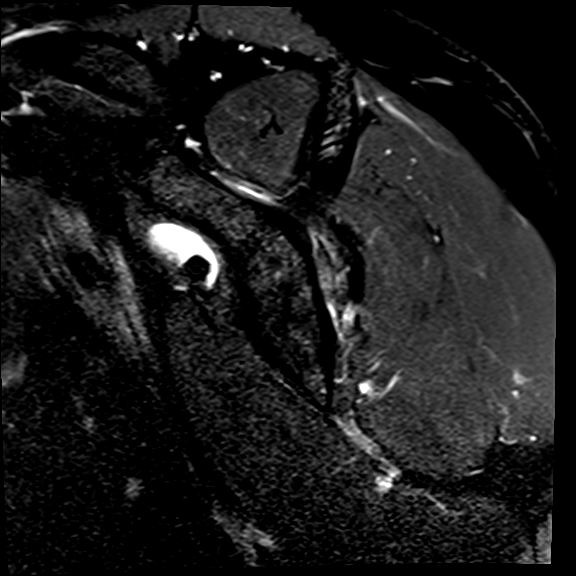
[im 10/24]
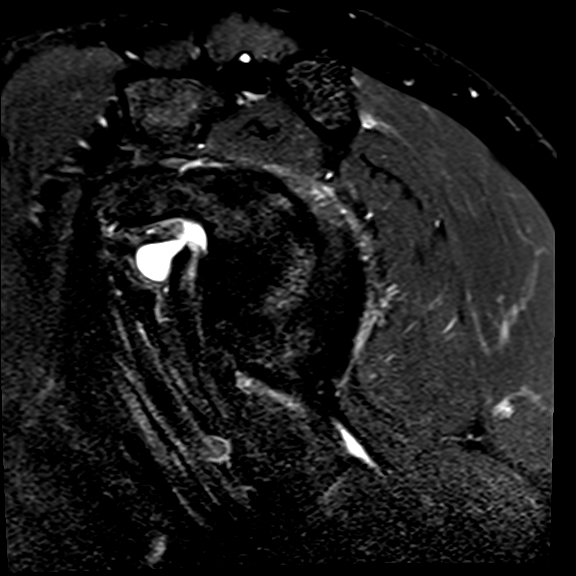
[im 14/24]
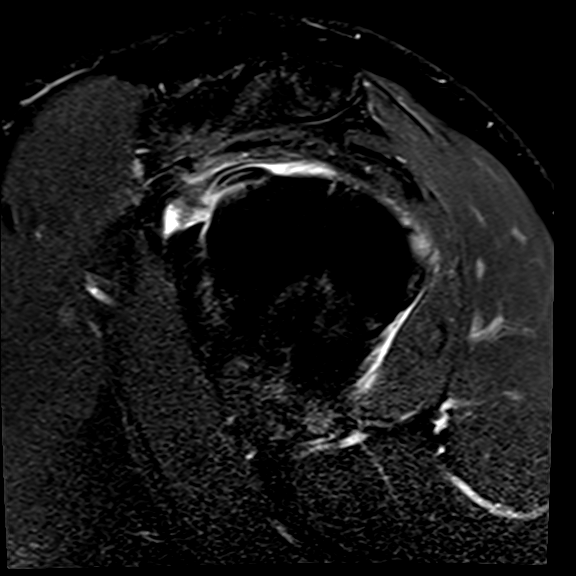
[im 17/24]
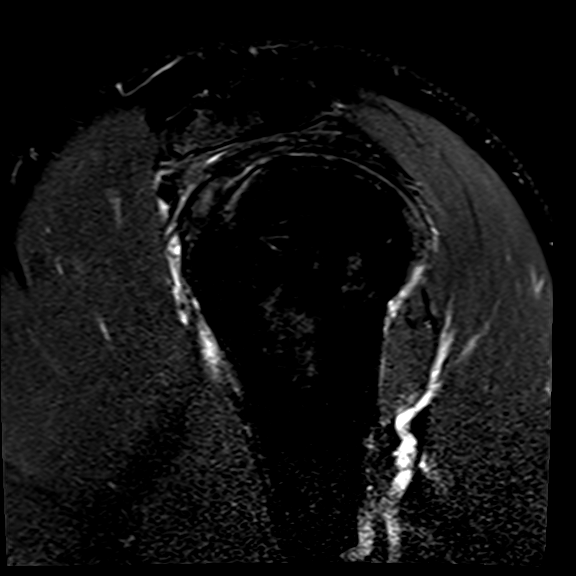
[im 20/24]
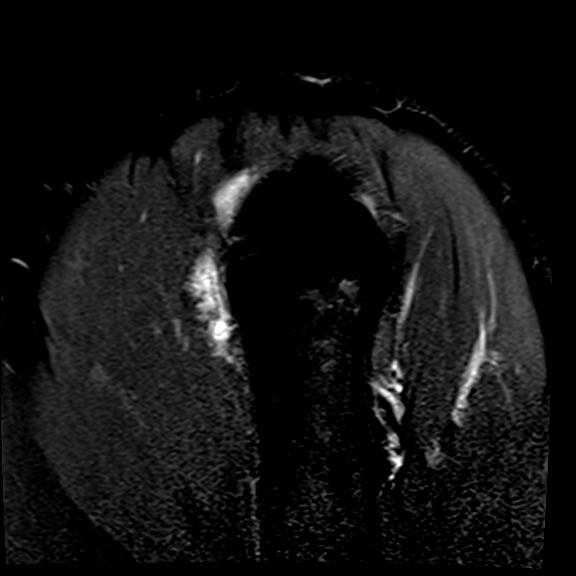
[im 24/24]
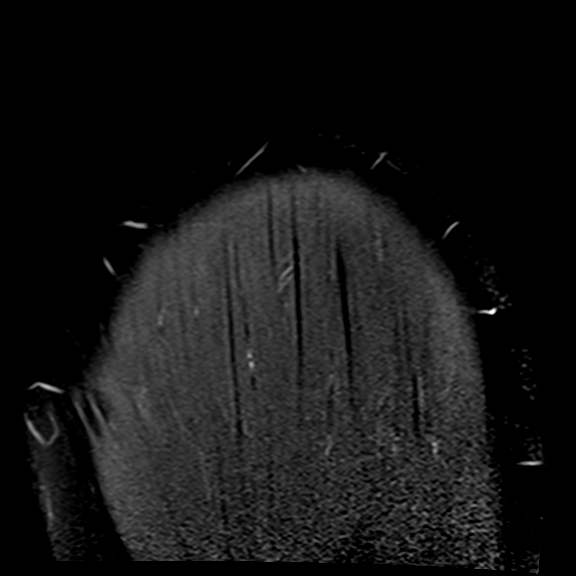

[Series 8: PD · oblique · right · 4.0mm · 0.47mm/px · 7 of 20 slices shown]
[im 1/20]
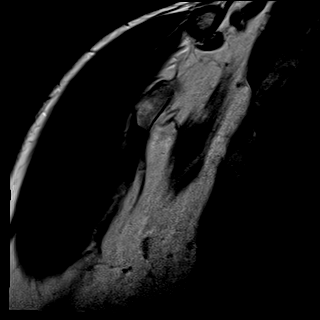
[im 4/20]
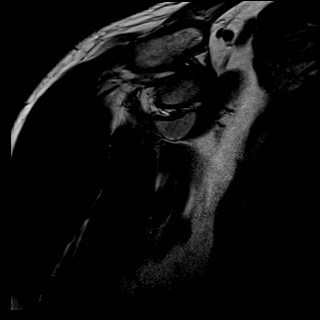
[im 7/20]
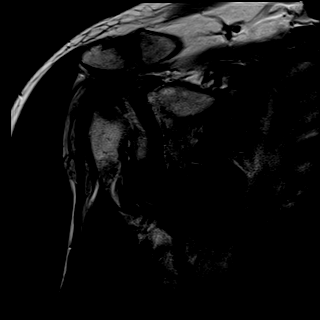
[im 10/20]
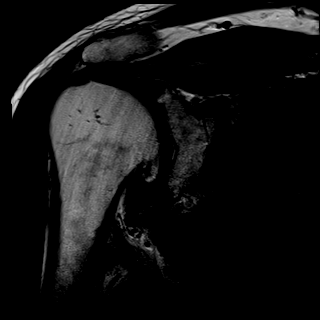
[im 13/20]
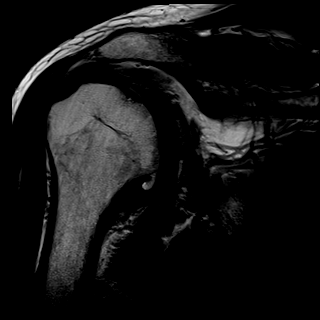
[im 16/20]
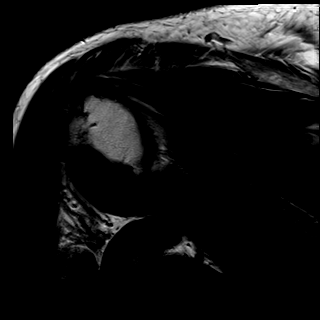
[im 20/20]
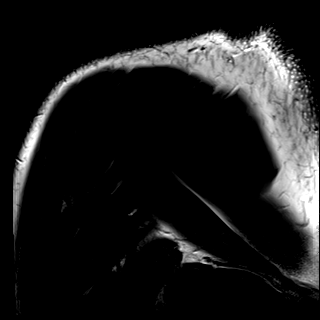

[Series 9: T2 fat-sat · oblique · right · 4.0mm · 0.44mm/px · 7 of 20 slices shown (3 of 3)]
[im 1/20]
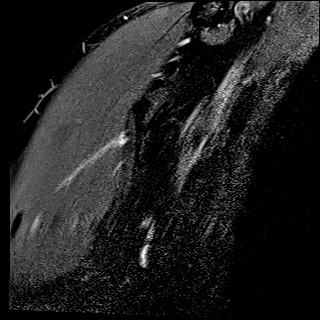
[im 4/20]
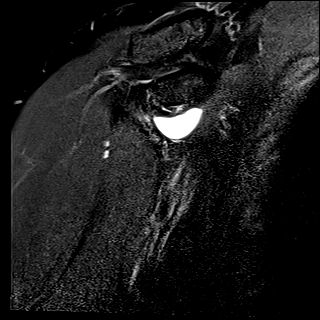
[im 7/20]
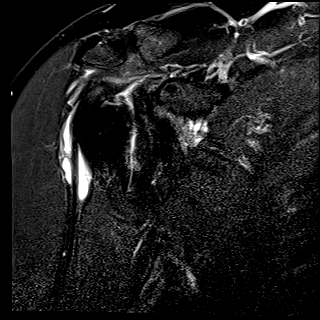
[im 10/20]
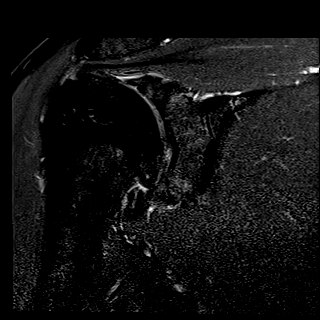
[im 13/20]
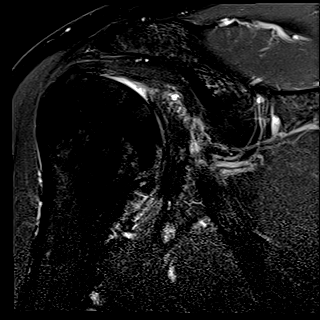
[im 16/20]
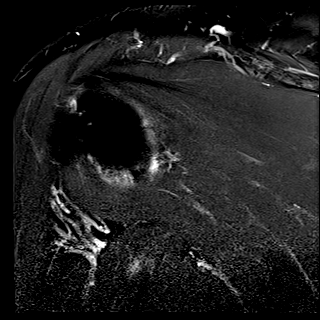
[im 20/20]
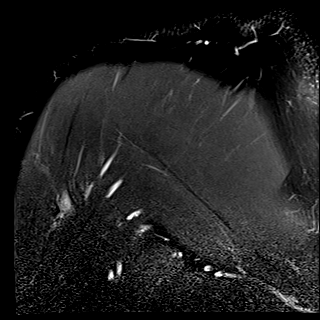

[31 of 40 positions shown; findings below may reference images not displayed]

FINDINGS: Rotator cuff: Intact. There is some supraspinatus and infraspinatus
tendinopathy.

Muscles:  Normal without atrophy or focal lesion.

Biceps long head: Intact. Tendinopathy of the intra-articular
segment noted.

Acromioclavicular Joint: Mild osteoarthritis type 1 acromion. No
subacromial/subdeltoid bursal fluid.

Glenohumeral Joint: There is advanced degenerative change with joint
space narrowing, a large osteophyte off the humeral head and mild
subchondral edema about the joint.

Labrum: The superior and inferior labrum appear markedly
degenerated.

Bones:  No fracture or worrisome lesion.

Other: None.
IMPRESSION: Dominant finding is advanced glenohumeral osteoarthritis.

Mild appearing supraspinatus and infraspinatus tendinopathy without
tear. Tendinopathy of the intra-articular long head of biceps also
noted.

Mild acromioclavicular osteoarthritis.

## 2021-06-10 ENCOUNTER — Ambulatory Visit: Payer: Self-pay

## 2021-06-10 ENCOUNTER — Other Ambulatory Visit: Payer: Self-pay

## 2021-06-10 DIAGNOSIS — Z23 Encounter for immunization: Secondary | ICD-10-CM

## 2022-02-17 ENCOUNTER — Other Ambulatory Visit: Payer: Self-pay | Admitting: Orthopedic Surgery

## 2022-02-17 DIAGNOSIS — M75121 Complete rotator cuff tear or rupture of right shoulder, not specified as traumatic: Secondary | ICD-10-CM

## 2022-02-26 ENCOUNTER — Ambulatory Visit
Admission: RE | Admit: 2022-02-26 | Discharge: 2022-02-26 | Disposition: A | Discharge status: Home / Self Care | Payer: Commercial Managed Care - PPO | Source: Ambulatory Visit | Attending: Orthopedic Surgery | Admitting: Orthopedic Surgery

## 2022-02-26 DIAGNOSIS — Z96611 Presence of right artificial shoulder joint: Secondary | ICD-10-CM | POA: Insufficient documentation

## 2022-02-26 DIAGNOSIS — M75121 Complete rotator cuff tear or rupture of right shoulder, not specified as traumatic: Secondary | ICD-10-CM

## 2022-02-26 DIAGNOSIS — M62511 Muscle wasting and atrophy, not elsewhere classified, right shoulder: Secondary | ICD-10-CM | POA: Insufficient documentation

## 2022-02-26 MED ORDER — IOHEXOL 180 MG/ML  SOLN
20.0000 mL | Freq: Once | INTRAMUSCULAR | Status: AC | PRN
  Administered 2022-02-26: 15 mL

## 2022-02-26 MED ORDER — LIDOCAINE HCL (PF) 1 % IJ SOLN
10.0000 mL | Freq: Once | INTRAMUSCULAR | Status: AC
  Administered 2022-02-26: 3 mL via INTRADERMAL

## 2022-02-26 MED ORDER — SODIUM CHLORIDE (PF) 0.9 % IJ SOLN
10.0000 mL | INTRAMUSCULAR | Status: DC | PRN
Start: 2022-02-26 — End: 2022-02-27
  Administered 2022-02-26: 5 mL via INTRAVENOUS

## 2022-05-04 DIAGNOSIS — I6529 Occlusion and stenosis of unspecified carotid artery: Secondary | ICD-10-CM | POA: Insufficient documentation

## 2022-05-04 NOTE — Progress Notes (Unsigned)
MRN : [id number]  Trevor Neal is a 61 y.o. (06-02-1961) male who presents with chief complaint of check carotid arteries.  History of Present Illness: ***  No outpatient medications have been marked as taking for the 05/06/22 encounter (Appointment) with Delana Meyer, Dolores Lory, MD.    Past Medical History:  Diagnosis Date   Cancer ([place])    basal cell-face   Foot drop    chronic-PT USES BRACE ON RIGHT LEG   Footdrop    chronic   Hyperlipidemia    Hypertension    has not been taking bp meds    Past Surgical History:  Procedure Laterality Date   BACK SURGERY  2008 2012   x5   COLONOSCOPY WITH PROPOFOL N/A 04/04/2015   Procedure: COLONOSCOPY WITH PROPOFOL;  Surgeon: Manya Silvas, MD;  Location: [place];  Service: Endoscopy;  Laterality: N/A;   foot drop surgery  2006   VENTRAL HERNIA REPAIR N/A 03/19/2016   Procedure: HERNIA REPAIR VENTRAL ADULT;  Surgeon: Leonie Green, MD;  Location: [facility];  Service: General;  Laterality: N/A;    Social History Social History   Tobacco Use   Smoking status: Never   Smokeless tobacco: Never  Substance Use Topics   Alcohol use: Yes    Comment: weekends only   Drug use: No    Family History No family history on file.  No Known Allergies   REVIEW OF SYSTEMS (Negative unless checked)  Constitutional: '[]'$ Weight loss  '[]'$ Fever  '[]'$ Chills Cardiac: '[]'$ Chest pain   '[]'$ Chest pressure   '[]'$ Palpitations   '[]'$ Shortness of breath when laying flat   '[]'$ Shortness of breath with exertion. Vascular:  '[x]'$ Pain in legs with walking   '[]'$ Pain in legs at rest  '[]'$ History of DVT   '[]'$ Phlebitis   '[]'$ Swelling in legs   '[]'$ Varicose veins   '[]'$ Non-healing ulcers Pulmonary:   '[]'$ Uses home oxygen   '[]'$ Productive cough   '[]'$ Hemoptysis   '[]'$ Wheeze  '[]'$ COPD   '[]'$ Asthma Neurologic:  '[]'$ Dizziness   '[]'$ Seizures   '[]'$ History of stroke   '[]'$ History of TIA  '[]'$ Aphasia   '[]'$ Vissual changes   '[]'$ Weakness or numbness in arm   '[]'$ Weakness or numbness in  leg Musculoskeletal:   '[]'$ Joint swelling   '[]'$ Joint pain   '[]'$ Low back pain Hematologic:  '[]'$ Easy bruising  '[]'$ Easy bleeding   '[]'$ Hypercoagulable state   '[]'$ Anemic Gastrointestinal:  '[]'$ Diarrhea   '[]'$ Vomiting  '[]'$ Gastroesophageal reflux/heartburn   '[]'$ Difficulty swallowing. Genitourinary:  '[]'$ Chronic kidney disease   '[]'$ Difficult urination  '[]'$ Frequent urination   '[]'$ Blood in urine Skin:  '[]'$ Rashes   '[]'$ Ulcers  Psychological:  '[]'$ History of anxiety   '[]'$  History of major depression.  Physical Examination  There were no vitals filed for this visit. There is no height or weight on file to calculate BMI. Gen: WD/WN, NAD Head: [place]/AT, No temporalis wasting.  Ear/Nose/Throat: Hearing grossly intact, nares w/o erythema or drainage Eyes: PER, EOMI, sclera nonicteric.  Neck: Supple, no masses.  No bruit or JVD.  Pulmonary:  Good air movement, no audible wheezing, no use of accessory muscles.  Cardiac: RRR, normal S1, S2, no Murmurs. Vascular:  carotid bruit noted Vessel Right Left  Radial Palpable Palpable  Carotid  Palpable  Palpable  Subclav  Palpable Palpable  Gastrointestinal: soft, non-distended. No guarding/no peritoneal signs.  Musculoskeletal: M/S 5/5 throughout.  No visible deformity.  Neurologic: CN 2-12 intact. Pain and light touch intact in extremities.  Symmetrical.  Speech is fluent. Motor exam as listed above. Psychiatric: Judgment intact,  Mood & affect appropriate for pt's clinical situation. Dermatologic: No rashes or ulcers noted.  No changes consistent with cellulitis.   CBC No results found for: "WBC", "HGB", "HCT", "MCV", "PLT"  BMET    Component Value Date/Time   K 3.8 03/18/2016 1430   CrCl cannot be calculated (No successful lab value found.).  COAG No results found for: "INR", "PROTIME"  Radiology No results found.   Assessment/Plan There are no diagnoses linked to this encounter.   Hortencia Pilar, MD  05/04/2022 4:47 PM

## 2022-05-06 ENCOUNTER — Ambulatory Visit (INDEPENDENT_AMBULATORY_CARE_PROVIDER_SITE_OTHER): Payer: Commercial Managed Care - PPO | Admitting: Vascular Surgery

## 2022-05-06 ENCOUNTER — Encounter (INDEPENDENT_AMBULATORY_CARE_PROVIDER_SITE_OTHER): Payer: Self-pay | Admitting: Vascular Surgery

## 2022-05-06 VITALS — BP 127/73 | HR 80 | Resp 17 | Ht 72.0 in | Wt 219.0 lb

## 2022-05-06 DIAGNOSIS — I10 Essential (primary) hypertension: Secondary | ICD-10-CM

## 2022-05-06 DIAGNOSIS — E782 Mixed hyperlipidemia: Secondary | ICD-10-CM

## 2022-05-06 DIAGNOSIS — I6529 Occlusion and stenosis of unspecified carotid artery: Secondary | ICD-10-CM

## 2022-06-14 ENCOUNTER — Encounter (INDEPENDENT_AMBULATORY_CARE_PROVIDER_SITE_OTHER): Payer: Self-pay

## 2023-04-27 ENCOUNTER — Other Ambulatory Visit: Payer: Self-pay | Admitting: Orthopedic Surgery

## 2023-04-27 DIAGNOSIS — Z96611 Presence of right artificial shoulder joint: Secondary | ICD-10-CM

## 2023-04-28 ENCOUNTER — Encounter: Payer: Self-pay | Admitting: Orthopedic Surgery

## 2023-05-04 ENCOUNTER — Ambulatory Visit
Admission: RE | Admit: 2023-05-04 | Discharge: 2023-05-04 | Disposition: A | Discharge status: Home / Self Care | Payer: Commercial Managed Care - PPO | Source: Ambulatory Visit | Attending: Orthopedic Surgery | Admitting: Orthopedic Surgery

## 2023-05-04 DIAGNOSIS — Z96611 Presence of right artificial shoulder joint: Secondary | ICD-10-CM

## 2023-05-04 MED ORDER — IOPAMIDOL (ISOVUE-M 200) INJECTION 41%
15.0000 mL | Freq: Once | INTRAMUSCULAR | Status: AC
  Administered 2023-05-04: 15 mL via INTRA_ARTICULAR

## 2023-08-05 ENCOUNTER — Other Ambulatory Visit: Payer: Self-pay | Admitting: Orthopedic Surgery

## 2023-08-23 ENCOUNTER — Encounter
Admission: RE | Admit: 2023-08-23 | Discharge: 2023-08-23 | Disposition: A | Discharge status: Home / Self Care | Payer: Commercial Managed Care - PPO | Source: Ambulatory Visit | Attending: Orthopedic Surgery | Admitting: Orthopedic Surgery

## 2023-08-23 ENCOUNTER — Other Ambulatory Visit: Payer: Self-pay

## 2023-08-23 DIAGNOSIS — M75121 Complete rotator cuff tear or rupture of right shoulder, not specified as traumatic: Secondary | ICD-10-CM | POA: Diagnosis not present

## 2023-08-23 DIAGNOSIS — Z01812 Encounter for preprocedural laboratory examination: Secondary | ICD-10-CM | POA: Diagnosis present

## 2023-08-23 DIAGNOSIS — Z01818 Encounter for other preprocedural examination: Secondary | ICD-10-CM | POA: Insufficient documentation

## 2023-08-23 DIAGNOSIS — Z01810 Encounter for preprocedural cardiovascular examination: Secondary | ICD-10-CM | POA: Diagnosis present

## 2023-08-23 HISTORY — DX: Gastro-esophageal reflux disease without esophagitis: K21.9

## 2023-08-23 LAB — COMPREHENSIVE METABOLIC PANEL
ALT: 27 U/L (ref 0–44)
AST: 21 U/L (ref 15–41)
Albumin: 4.2 g/dL (ref 3.5–5.0)
Alkaline Phosphatase: 71 U/L (ref 38–126)
Anion gap: 7 (ref 5–15)
BUN: 16 mg/dL (ref 8–23)
CO2: 25 mmol/L (ref 22–32)
Calcium: 8.9 mg/dL (ref 8.9–10.3)
Chloride: 106 mmol/L (ref 98–111)
Creatinine, Ser: 1.01 mg/dL (ref 0.61–1.24)
GFR, Estimated: >60 mL/min (ref >60)
Glucose, Bld: 112 mg/dL — ABNORMAL HIGH (ref 70–99)
Potassium: 3.7 mmol/L (ref 3.5–5.1)
Sodium: 138 mmol/L (ref 135–145)
Total Bilirubin: 0.9 mg/dL (ref 0.0–1.2)
Total Protein: 7.3 g/dL (ref 6.5–8.1)

## 2023-08-23 LAB — URINALYSIS, ROUTINE W REFLEX MICROSCOPIC
Bacteria, UA: NONE SEEN
Bilirubin Urine: NEGATIVE
Glucose, UA: NEGATIVE mg/dL
Ketones, ur: NEGATIVE mg/dL
Leukocytes,Ua: NEGATIVE
Nitrite: NEGATIVE
Protein, ur: NEGATIVE mg/dL
Specific Gravity, Urine: 1.013 (ref 1.005–1.030)
Squamous Epithelial / HPF: 0 /[HPF] (ref 0–5)
pH: 5 (ref 5.0–8.0)

## 2023-08-23 LAB — CBC WITH DIFFERENTIAL/PLATELET
Abs Immature Granulocytes: 0.02 10*3/uL (ref 0.00–0.07)
Basophils Absolute: 0.1 10*3/uL (ref 0.0–0.1)
Basophils Relative: 1 %
Eosinophils Absolute: 0.2 10*3/uL (ref 0.0–0.5)
Eosinophils Relative: 2 %
HCT: 45.8 % (ref 39.0–52.0)
Hemoglobin: 15.5 g/dL (ref 13.0–17.0)
Immature Granulocytes: 0 %
Lymphocytes Relative: 28 %
Lymphs Abs: 2.1 10*3/uL (ref 0.7–4.0)
MCH: 31 pg (ref 26.0–34.0)
MCHC: 33.8 g/dL (ref 30.0–36.0)
MCV: 91.6 fL (ref 80.0–100.0)
Monocytes Absolute: 0.7 10*3/uL (ref 0.1–1.0)
Monocytes Relative: 9 %
Neutro Abs: 4.4 10*3/uL (ref 1.7–7.7)
Neutrophils Relative %: 60 %
Platelets: 269 10*3/uL (ref 150–400)
RBC: 5 MIL/uL (ref 4.22–5.81)
RDW: 12.5 % (ref 11.5–15.5)
WBC: 7.4 10*3/uL (ref 4.0–10.5)
nRBC: 0 % (ref 0.0–0.2)

## 2023-08-23 LAB — SURGICAL PCR SCREEN
MRSA, PCR: NEGATIVE
Staphylococcus aureus: NEGATIVE

## 2023-08-23 NOTE — Patient Instructions (Signed)
 Your procedure is scheduled on: Jan 14/2025 Tuesday Report to the Registration Desk on the 1st floor of the [facility] To find out your arrival time, please call [phone] between 1PM - 3PM on: Jan 13,2025 Monday If your arrival time is 6:00 am, do not arrive before that time as the [facility] entrance doors do not open until 6:00 am.  REMEMBER: Instructions that are not followed completely may result in serious medical risk, up to and including death; or upon the discretion of your surgeon and anesthesiologist your surgery may need to be rescheduled.  Do not eat food after midnight the night before surgery.  No gum chewing or hard candies.  You may however, drink CLEAR liquids up to 2 hours before you are scheduled to arrive for your surgery. Do not drink anything within 2 hours of your scheduled arrival time.  Clear liquids include: - water   - apple juice without pulp - gatorade (not RED colors) - black coffee or tea (Do NOT add milk or creamers to the coffee or tea) Do NOT drink anything that is not on this list.  In addition, your doctor has ordered for you to drink the provided:  Ensure Pre-Surgery Clear Carbohydrate Drink   Drinking this carbohydrate drink up to two hours before surgery helps to reduce insulin resistance and improve patient outcomes. Please complete drinking 2 hours before scheduled arrival time.  One week prior to surgery: Stop Anti-inflammatories (NSAIDS) such as Advil, Aleve, Ibuprofen, Motrin, Naproxen, Naprosyn and Aspirin  based products such as Excedrin, Goody's Powder, BC Powder. Stop ANY OVER THE COUNTER supplements until after surgery.  You may however, continue to take Tylenol  if needed for pain up until the day of surgery.   Continue taking all of your other prescription medications up until the day of surgery.  ON THE DAY OF SURGERY ONLY TAKE THESE MEDICATIONS WITH SIPS OF WATER :  pantoprazole (PROTONIX)  Continue using Flonase as  prescribed.  No Alcohol for 24 hours before or after surgery.  No Smoking including e-cigarettes for 24 hours before surgery.  No chewable tobacco products for at least 6 hours before surgery.  No nicotine patches on the day of surgery.  Do not use any recreational drugs for at least a week (preferably 2 weeks) before your surgery.  Please be advised that the combination of cocaine and anesthesia may have negative outcomes, up to and including death. If you test positive for cocaine, your surgery will be cancelled.  On the morning of surgery brush your teeth with toothpaste and water , you may rinse your mouth with mouthwash if you wish. Do not swallow any toothpaste or mouthwash.  Use CHG Soap  as directed on instruction sheet.-provided for you  Do not wear jewelry, make-up, hairpins, clips or nail polish.  For welded (permanent) jewelry: bracelets, anklets, waist bands, etc.  Please have this removed prior to surgery.  If it is not removed, there is a chance that hospital personnel will need to cut it off on the day of surgery.  Do not wear lotions, powders, or perfumes.   Do not shave body hair from the neck down 48 hours before surgery.  Contact lenses, hearing aids and dentures may not be worn into surgery.  Do not bring valuables to the hospital. [facility] is not responsible for any missing/lost belongings or valuables.   Total Shoulder Arthroplasty:  use Benzoyl Peroxide 5% Gel as directed on instruction sheet.   Notify your doctor if there is  any change in your medical condition (cold, fever, infection).  Wear comfortable clothing (specific to your surgery type) to the hospital.  After surgery, you can help prevent lung complications by doing breathing exercises.  Take deep breaths and cough every 1-2 hours. Your doctor may order a device called an Incentive Spirometer to help you take deep breaths. If you are being admitted to the hospital overnight, leave your  suitcase in the car. After surgery it may be brought to your room.  In case of increased patient census, it may be necessary for you, the patient, to continue your postoperative care in the Same Day Surgery department.  If you are being discharged the day of surgery, you will not be allowed to drive home. You will need a responsible individual to drive you home and stay with you for 24 hours after surgery.     Please call the Pre-admissions Testing Dept. at [phone] if you have any questions about these instructions.  Surgery Visitation Policy:  Patients having surgery or a procedure may have two visitors.  Children under the age of 79 must have an adult with them who is not the patient.  Inpatient Visitation:    Visiting hours are 7 a.m. to 8 p.m. Up to four visitors are allowed at one time in a patient room. The visitors may rotate out with other people during the day.  One visitor age 78 or older may stay with the patient overnight and must be in the room by 8 p.m.    Preparing for Total Shoulder Arthroplasty  Before surgery, you can play an important role by reducing the number of germs on your skin by using the following products:  Benzoyl Peroxide Gel  o Reduces the number of germs present on the skin  o Applied twice a day to shoulder area starting two days before surgery  Chlorhexidine  Gluconate (CHG) Soap  o An antiseptic cleaner that kills germs and bonds with the skin to continue killing germs even after washing  o Used for showering the night before surgery and morning of surgery             BENZOYL PEROXIDE 5% GEL          2 days before surgery  Am   _____________  Pm ___________                    1 day before surgery  Am ____________        Pm____________   The PM dose should be applied after the CHG wash up. Night before surgery      Please do not use if you have an allergy to benzoyl peroxide. If your skin becomes  reddened/irritated stop using the benzoyl peroxide.  Starting two days before surgery, apply as follows:  1. Apply benzoyl peroxide in the morning and at night. Apply after taking a shower. If you are not taking a shower, clean entire shoulder front, back, and side along with the armpit with a clean wet washcloth.  2. Place a quarter-sized dollop on your shoulder and rub in thoroughly, making sure to cover the front, back, and side of your shoulder, along with the armpit.  2 days before ____ AM ____ PM 1 day before ____ AM ____ PM  3. Do this twice a day for two days. (Last application is the night before surgery, AFTER using the CHG soap).  4. Do NOT apply benzoyl peroxide gel on the day of  surgery.      Pre-operative 5 CHG Bath Instructions   You can play a key role in reducing the risk of infection after surgery. Your skin needs to be as free of germs as possible. You can reduce the number of germs on your skin by washing with CHG (chlorhexidine  gluconate) soap before surgery. CHG is an antiseptic soap that kills germs and continues to kill germs even after washing.   DO NOT use if you have an allergy to chlorhexidine /CHG or antibacterial soaps. If your skin becomes reddened or irritated, stop using the CHG and notify one of our RNs at [phone].   Please shower with the CHG soap starting 4 days before surgery using the following schedule:     Please keep in mind the following:  DO NOT shave, including legs and underarms, starting the day of your first shower.   You may shave your face at any point before/day of surgery.  Place clean sheets on your bed the day you start using CHG soap. Use a clean washcloth (not used since being washed) for each shower. DO NOT sleep with pets once you start using the CHG.   CHG Shower Instructions:  If you choose to wash your hair and private area, wash first with your normal shampoo/soap.  After you use shampoo/soap, rinse your hair and  body thoroughly to remove shampoo/soap residue.  Turn the water  OFF and apply about 3 tablespoons (45 ml) of CHG soap to a CLEAN washcloth.  Apply CHG soap ONLY FROM YOUR NECK DOWN TO YOUR TOES (washing for 3-5 minutes)  DO NOT use CHG soap on face, private areas, open wounds, or sores.  Pay special attention to the area where your surgery is being performed.  If you are having back surgery, having someone wash your back for you may be helpful. Wait 2 minutes after CHG soap is applied, then you may rinse off the CHG soap.  Pat dry with a clean towel  Put on clean clothes/pajamas   If you choose to wear lotion, please use ONLY the CHG-compatible lotions on the back of this paper.     Additional instructions for the day of surgery: DO NOT APPLY any lotions, deodorants, cologne, or perfumes.   Put on clean/comfortable clothes.  Brush your teeth.  Ask your nurse before applying any prescription medications to the skin.      CHG Compatible Lotions   Aveeno Moisturizing lotion  Cetaphil Moisturizing Cream  Cetaphil Moisturizing Lotion  Clairol Herbal Essence Moisturizing Lotion, Dry Skin  Clairol Herbal Essence Moisturizing Lotion, Extra Dry Skin  Clairol Herbal Essence Moisturizing Lotion, Normal Skin  Curel Age Defying Therapeutic Moisturizing Lotion with Alpha Hydroxy  Curel Extreme Care Body Lotion  Curel Soothing Hands Moisturizing Hand Lotion  Curel Therapeutic Moisturizing Cream, Fragrance-Free  Curel Therapeutic Moisturizing Lotion, Fragrance-Free  Curel Therapeutic Moisturizing Lotion, Original Formula  Eucerin Daily Replenishing Lotion  Eucerin Dry Skin Therapy Plus Alpha Hydroxy Crme  Eucerin Dry Skin Therapy Plus Alpha Hydroxy Lotion  Eucerin Original Crme  Eucerin Original Lotion  Eucerin Plus Crme Eucerin Plus Lotion  Eucerin TriLipid Replenishing Lotion  Keri Anti-Bacterial Hand Lotion  Keri Deep Conditioning Original Lotion Dry Skin Formula Softly Scented   Keri Deep Conditioning Original Lotion, Fragrance Free Sensitive Skin Formula  Keri Lotion Fast Absorbing Fragrance Free Sensitive Skin Formula  Keri Lotion Fast Absorbing Softly Scented Dry Skin Formula  Keri Original Lotion  Keri Skin Renewal Lotion Keri Silky Smooth Lotion  [place]  Silky Smooth Sensitive Skin Lotion  Nivea Body Creamy Conditioning Oil  Nivea Body Extra Enriched Teacher, Adult Education Moisturizing Lotion Nivea Crme  Nivea Skin Firming Lotion  NutraDerm 30 Skin Lotion  NutraDerm Skin Lotion  NutraDerm Therapeutic Skin Cream  NutraDerm Therapeutic Skin Lotion  ProShield Protective Hand Cream  Provon moisturizing lotion

## 2023-08-29 MED ORDER — CEFAZOLIN SODIUM-DEXTROSE 2-4 GM/100ML-% IV SOLN
2.0000 g | INTRAVENOUS | Status: AC
  Administered 2023-08-30: 2 g via INTRAVENOUS

## 2023-08-29 MED ORDER — CHLORHEXIDINE GLUCONATE 0.12 % MT SOLN
15.0000 mL | Freq: Once | OROMUCOSAL | Status: AC
  Administered 2023-08-30: 15 mL via OROMUCOSAL

## 2023-08-29 MED ORDER — LACTATED RINGERS IV SOLN: INTRAVENOUS | Status: DC

## 2023-08-29 MED ORDER — TRANEXAMIC ACID-NACL 1000-0.7 MG/100ML-% IV SOLN
1000.0000 mg | INTRAVENOUS | Status: AC
  Administered 2023-08-30: 1000 mg via INTRAVENOUS

## 2023-08-29 MED ORDER — ORAL CARE MOUTH RINSE: 15.0000 mL | Freq: Once | OROMUCOSAL | Status: AC

## 2023-08-30 ENCOUNTER — Inpatient Hospital Stay: Payer: Commercial Managed Care - PPO

## 2023-08-30 ENCOUNTER — Encounter: Payer: Self-pay | Admitting: Orthopedic Surgery

## 2023-08-30 ENCOUNTER — Encounter
Admission: AD | Disposition: A | Discharge status: Home / Self Care | Payer: Self-pay | Source: Ambulatory Visit | Attending: Orthopedic Surgery

## 2023-08-30 ENCOUNTER — Inpatient Hospital Stay: Payer: Commercial Managed Care - PPO | Admitting: Certified Registered"

## 2023-08-30 ENCOUNTER — Ambulatory Visit
Admission: AD | Admit: 2023-08-30 | Discharge: 2023-08-30 | Disposition: A | Discharge status: Home / Self Care | Payer: Commercial Managed Care - PPO | Source: Ambulatory Visit | Attending: Orthopedic Surgery | Admitting: Orthopedic Surgery

## 2023-08-30 ENCOUNTER — Inpatient Hospital Stay: Payer: Commercial Managed Care - PPO | Admitting: Urgent Care

## 2023-08-30 ENCOUNTER — Other Ambulatory Visit: Payer: Self-pay

## 2023-08-30 DIAGNOSIS — Z01812 Encounter for preprocedural laboratory examination: Secondary | ICD-10-CM

## 2023-08-30 DIAGNOSIS — K219 Gastro-esophageal reflux disease without esophagitis: Secondary | ICD-10-CM | POA: Diagnosis not present

## 2023-08-30 DIAGNOSIS — M75121 Complete rotator cuff tear or rupture of right shoulder, not specified as traumatic: Secondary | ICD-10-CM | POA: Insufficient documentation

## 2023-08-30 HISTORY — PX: TOTAL SHOULDER REVISION: SHX6130

## 2023-08-30 SURGERY — REVISION, TOTAL ARTHROPLASTY, SHOULDER
Anesthesia: General | Site: Shoulder | Laterality: Right

## 2023-08-30 MED ORDER — FENTANYL CITRATE (PF) 100 MCG/2ML IJ SOLN
50.0000 ug | Freq: Once | INTRAMUSCULAR | Status: DC
Start: 2023-08-30 — End: 2023-08-30

## 2023-08-30 MED ORDER — PHENYLEPHRINE HCL-NACL 20-0.9 MG/250ML-% IV SOLN
INTRAVENOUS | Status: DC | PRN
  Administered 2023-08-30: 80 ug/min via INTRAVENOUS

## 2023-08-30 MED ORDER — ACETAMINOPHEN 500 MG PO TABS: 1000.0000 mg | ORAL_TABLET | Freq: Three times a day (TID) | ORAL | 2 refills | Status: AC

## 2023-08-30 MED ORDER — CEFAZOLIN SODIUM-DEXTROSE 2-4 GM/100ML-% IV SOLN
2.0000 g | Freq: Once | INTRAVENOUS | Status: AC
  Administered 2023-08-30: 2 g via INTRAVENOUS

## 2023-08-30 MED ORDER — FENTANYL CITRATE (PF) 100 MCG/2ML IJ SOLN
INTRAMUSCULAR | Status: DC | PRN
  Administered 2023-08-30: 75 ug via INTRAVENOUS
  Administered 2023-08-30: 25 ug via INTRAVENOUS

## 2023-08-30 MED ORDER — SODIUM CHLORIDE 0.9 % IR SOLN
Status: DC | PRN
  Administered 2023-08-30: 3000 mL

## 2023-08-30 MED ORDER — DEXMEDETOMIDINE HCL IN NACL 80 MCG/20ML IV SOLN
INTRAVENOUS | Status: DC | PRN
  Administered 2023-08-30 (×2): 8 ug via INTRAVENOUS

## 2023-08-30 MED ORDER — BUPIVACAINE HCL (PF) 0.5 % IJ SOLN
INTRAMUSCULAR | Status: AC
  Filled 2023-08-30: qty 10

## 2023-08-30 MED ORDER — OXYCODONE HCL 5 MG/5ML PO SOLN: 5.0000 mg | Freq: Once | ORAL | Status: DC | PRN

## 2023-08-30 MED ORDER — GLYCOPYRROLATE 0.2 MG/ML IJ SOLN
INTRAMUSCULAR | Status: DC | PRN
  Administered 2023-08-30: .2 mg via INTRAVENOUS

## 2023-08-30 MED ORDER — BUPIVACAINE LIPOSOME 1.3 % IJ SUSP
INTRAMUSCULAR | Status: AC
  Filled 2023-08-30: qty 20

## 2023-08-30 MED ORDER — TRANEXAMIC ACID-NACL 1000-0.7 MG/100ML-% IV SOLN
1000.0000 mg | INTRAVENOUS | Status: AC
  Administered 2023-08-30: 1000 mg via INTRAVENOUS

## 2023-08-30 MED ORDER — 0.9 % SODIUM CHLORIDE (POUR BTL) OPTIME
TOPICAL | Status: DC | PRN
  Administered 2023-08-30: 500 mL

## 2023-08-30 MED ORDER — FENTANYL CITRATE PF 50 MCG/ML IJ SOSY
PREFILLED_SYRINGE | INTRAMUSCULAR | Status: AC
  Filled 2023-08-30: qty 1

## 2023-08-30 MED ORDER — TRANEXAMIC ACID-NACL 1000-0.7 MG/100ML-% IV SOLN
INTRAVENOUS | Status: AC
  Filled 2023-08-30: qty 100

## 2023-08-30 MED ORDER — EPHEDRINE 5 MG/ML INJ
INTRAVENOUS | Status: AC
Start: 2023-08-30 — End: ?
  Filled 2023-08-30: qty 5

## 2023-08-30 MED ORDER — MIDAZOLAM HCL 2 MG/2ML IJ SOLN
INTRAMUSCULAR | Status: AC
  Filled 2023-08-30: qty 2

## 2023-08-30 MED ORDER — FENTANYL CITRATE (PF) 100 MCG/2ML IJ SOLN: 25.0000 ug | INTRAMUSCULAR | Status: DC | PRN

## 2023-08-30 MED ORDER — DEXAMETHASONE SODIUM PHOSPHATE 10 MG/ML IJ SOLN
INTRAMUSCULAR | Status: DC | PRN
  Administered 2023-08-30: 10 mg via INTRAVENOUS

## 2023-08-30 MED ORDER — ROCURONIUM BROMIDE 100 MG/10ML IV SOLN
INTRAVENOUS | Status: DC | PRN
Start: 2023-08-30 — End: 2023-08-30
  Administered 2023-08-30 (×2): 20 mg via INTRAVENOUS
  Administered 2023-08-30: 50 mg via INTRAVENOUS

## 2023-08-30 MED ORDER — SUGAMMADEX SODIUM 200 MG/2ML IV SOLN
INTRAVENOUS | Status: DC | PRN
  Administered 2023-08-30: 200 mg via INTRAVENOUS

## 2023-08-30 MED ORDER — ONDANSETRON 4 MG PO TBDP: 4.0000 mg | ORAL_TABLET | Freq: Three times a day (TID) | ORAL | 0 refills | Status: DC | PRN

## 2023-08-30 MED ORDER — OXYCODONE HCL 5 MG PO TABS: 5.0000 mg | ORAL_TABLET | Freq: Once | ORAL | Status: DC | PRN

## 2023-08-30 MED ORDER — CEFAZOLIN SODIUM-DEXTROSE 2-4 GM/100ML-% IV SOLN
INTRAVENOUS | Status: AC
  Filled 2023-08-30: qty 100

## 2023-08-30 MED ORDER — PROPOFOL 10 MG/ML IV BOLUS
INTRAVENOUS | Status: DC | PRN
  Administered 2023-08-30: 120 mg via INTRAVENOUS

## 2023-08-30 MED ORDER — PHENYLEPHRINE HCL-NACL 20-0.9 MG/250ML-% IV SOLN
INTRAVENOUS | Status: AC
  Filled 2023-08-30: qty 250

## 2023-08-30 MED ORDER — BUPIVACAINE HCL (PF) 0.5 % IJ SOLN
INTRAMUSCULAR | Status: DC | PRN
  Administered 2023-08-30: 3 mL via PERINEURAL
  Administered 2023-08-30: 7 mL via PERINEURAL

## 2023-08-30 MED ORDER — PROPOFOL 10 MG/ML IV BOLUS
INTRAVENOUS | Status: AC
  Filled 2023-08-30: qty 20

## 2023-08-30 MED ORDER — ROCURONIUM BROMIDE 10 MG/ML (PF) SYRINGE
PREFILLED_SYRINGE | INTRAVENOUS | Status: AC
  Filled 2023-08-30: qty 10

## 2023-08-30 MED ORDER — VANCOMYCIN HCL 1000 MG IV SOLR
INTRAVENOUS | Status: AC
  Filled 2023-08-30: qty 20

## 2023-08-30 MED ORDER — LIDOCAINE HCL (PF) 1 % IJ SOLN
INTRAMUSCULAR | Status: AC
  Filled 2023-08-30: qty 2

## 2023-08-30 MED ORDER — IRRISEPT - 450ML BOTTLE WITH 0.05% CHG IN STERILE WATER, USP 99.95% OPTIME
TOPICAL | Status: DC | PRN
  Administered 2023-08-30: 450 mL

## 2023-08-30 MED ORDER — BUPIVACAINE LIPOSOME 1.3 % IJ SUSP
INTRAMUSCULAR | Status: DC | PRN
  Administered 2023-08-30: 7 mL via PERINEURAL
  Administered 2023-08-30: 13 mL via PERINEURAL

## 2023-08-30 MED ORDER — OXYCODONE HCL 5 MG PO TABS: 5.0000 mg | ORAL_TABLET | ORAL | 0 refills | Status: DC | PRN

## 2023-08-30 MED ORDER — LIDOCAINE HCL (PF) 2 % IJ SOLN
INTRAMUSCULAR | Status: AC
  Filled 2023-08-30: qty 5

## 2023-08-30 MED ORDER — VANCOMYCIN HCL 1000 MG IV SOLR
INTRAVENOUS | Status: DC | PRN
  Administered 2023-08-30: 1000 mg via TOPICAL

## 2023-08-30 MED ORDER — LIDOCAINE HCL (CARDIAC) PF 100 MG/5ML IV SOSY
PREFILLED_SYRINGE | INTRAVENOUS | Status: DC | PRN
Start: 2023-08-30 — End: 2023-08-30
  Administered 2023-08-30: 100 mg via INTRAVENOUS

## 2023-08-30 MED ORDER — CHLORHEXIDINE GLUCONATE 0.12 % MT SOLN
OROMUCOSAL | Status: AC
  Filled 2023-08-30: qty 15

## 2023-08-30 MED ORDER — EPHEDRINE SULFATE-NACL 50-0.9 MG/10ML-% IV SOSY
PREFILLED_SYRINGE | INTRAVENOUS | Status: DC | PRN
  Administered 2023-08-30: 5 mg via INTRAVENOUS

## 2023-08-30 MED ORDER — ACETAMINOPHEN 10 MG/ML IV SOLN
INTRAVENOUS | Status: AC
Start: 2023-08-30 — End: ?
  Filled 2023-08-30: qty 100

## 2023-08-30 MED ORDER — MIDAZOLAM BOLUS VIA INFUSION: 1.0000 mg | Freq: Once | INTRAVENOUS | Status: DC

## 2023-08-30 MED ORDER — LIDOCAINE HCL (PF) 1 % IJ SOLN
INTRAMUSCULAR | Status: AC
  Filled 2023-08-30: qty 5

## 2023-08-30 MED ORDER — ACETAMINOPHEN 10 MG/ML IV SOLN
INTRAVENOUS | Status: DC | PRN
  Administered 2023-08-30: 1000 mg via INTRAVENOUS

## 2023-08-30 MED ORDER — ONDANSETRON HCL 4 MG/2ML IJ SOLN
INTRAMUSCULAR | Status: DC | PRN
  Administered 2023-08-30: 4 mg via INTRAVENOUS

## 2023-08-30 MED ORDER — ASPIRIN 325 MG PO TBEC: 325.0000 mg | DELAYED_RELEASE_TABLET | Freq: Every day | ORAL | 0 refills | Status: AC

## 2023-08-30 MED ORDER — FENTANYL CITRATE (PF) 100 MCG/2ML IJ SOLN
INTRAMUSCULAR | Status: AC
  Filled 2023-08-30: qty 2

## 2023-08-30 SURGICAL SUPPLY — 76 items
AUGMENT BASEPLATE 25M 35D WEDG (Joint) IMPLANT
BASEPLATE AUGMNT 25M 35D WEDGE (Joint) ×1 IMPLANT
BIT DRILL 3.2 PERIPHERAL SCREW (BIT) IMPLANT
BLADE SAGITTAL WIDE XTHICK NO (BLADE) ×1 IMPLANT
CHLORAPREP W/TINT 26 (MISCELLANEOUS) ×1 IMPLANT
CNTNR URN SCR LID CUP LEK RST (MISCELLANEOUS) IMPLANT
COOLER POLAR GLACIER W/PUMP (MISCELLANEOUS) ×1 IMPLANT
DERMABOND ADVANCED .7 DNX12 (GAUZE/BANDAGES/DRESSINGS) IMPLANT
DRAPE INCISE IOBAN 66X45 STRL (DRAPES) ×2 IMPLANT
DRAPE SHEET LG 3/4 BI-LAMINATE (DRAPES) ×2 IMPLANT
DRAPE TABLE BACK 80X90 (DRAPES) ×1 IMPLANT
DRAPE U-SHAPE 47X51 STRL (DRAPES) ×1 IMPLANT
DRSG OPSITE POSTOP 3X4 (GAUZE/BANDAGES/DRESSINGS) IMPLANT
DRSG OPSITE POSTOP 4X6 (GAUZE/BANDAGES/DRESSINGS) IMPLANT
DRSG OPSITE POSTOP 4X8 (GAUZE/BANDAGES/DRESSINGS) IMPLANT
DRSG TEGADERM 2-3/8X2-3/4 SM (GAUZE/BANDAGES/DRESSINGS) IMPLANT
ELECT REM PT RETURN 9FT ADLT (ELECTROSURGICAL) ×1
ELECTRODE REM PT RTRN 9FT ADLT (ELECTROSURGICAL) ×1 IMPLANT
EVACUATOR 1/8 PVC DRAIN (DRAIN) IMPLANT
GAUZE SPONGE 2X2 STRL 8-PLY (GAUZE/BANDAGES/DRESSINGS) IMPLANT
GAUZE XEROFORM 1X8 LF (GAUZE/BANDAGES/DRESSINGS) IMPLANT
GLENOSPHERE +2 INF OFFSET 36 (Shoulder) IMPLANT
GLOVE BIO SURGEON STRL SZ 6.5 (GLOVE) IMPLANT
GLOVE BIOGEL PI IND STRL 8 (GLOVE) ×2 IMPLANT
GLOVE PI ULTRA LF STRL 7.5 (GLOVE) ×2 IMPLANT
GLOVE SURG ORTHO 8.0 STRL STRW (GLOVE) ×2 IMPLANT
GLOVE SURG SYN 8.0 (GLOVE) ×1
GLOVE SURG SYN 8.0 PF PI (GLOVE) ×1 IMPLANT
GOWN STRL REUS W/ TWL LRG LVL3 (GOWN DISPOSABLE) ×2 IMPLANT
GOWN STRL REUS W/ TWL XL LVL3 (GOWN DISPOSABLE) ×1 IMPLANT
GUIDEWIRE GLENOID 2.5X220 (WIRE) IMPLANT
HOOD PEEL AWAY T7 (MISCELLANEOUS) ×2 IMPLANT
IMPL REVERSE SHOULDER 0X3.5 (Shoulder) IMPLANT
IMPLANT REVERSE SHOULDER 0X3.5 (Shoulder) ×1 IMPLANT
INSERT HUM REVERSE 36 +6 (Insert) IMPLANT
JET LAVAGE IRRISEPT WOUND (IRRIGATION / IRRIGATOR) ×1
KIT STABILIZATION SHOULDER (MISCELLANEOUS) ×1 IMPLANT
LAVAGE JET IRRISEPT WOUND (IRRIGATION / IRRIGATOR) IMPLANT
MANIFOLD NEPTUNE II (INSTRUMENTS) ×1 IMPLANT
MASK FACE SPIDER DISP (MASK) ×1 IMPLANT
MAT ABSORB FLUID 56X50 GRAY (MISCELLANEOUS) ×1 IMPLANT
NDL REVERSE CUT 1/2 CRC (NEEDLE) IMPLANT
NDL SPNL 20GX3.5 QUINCKE YW (NEEDLE) IMPLANT
NEEDLE REVERSE CUT 1/2 CRC (NEEDLE)
NEEDLE SPNL 20GX3.5 QUINCKE YW (NEEDLE)
NS IRRIG 1000ML POUR BTL (IV SOLUTION) ×1 IMPLANT
PACK ARTHROSCOPY SHOULDER (MISCELLANEOUS) ×1 IMPLANT
PAD WRAPON POLAR SHDR XLG (MISCELLANEOUS) ×1 IMPLANT
PULSAVAC PLUS IRRIG FAN TIP (DISPOSABLE) ×1
SCREW 5.0X18 (Screw) IMPLANT
SCREW 5.5X26 (Screw) IMPLANT
SCREW BONE THREAD 6.5X35 (Screw) IMPLANT
SCREW PERIPHERAL 30 (Screw) IMPLANT
SLING ULTRA II LG (MISCELLANEOUS) IMPLANT
SLING ULTRA II M (MISCELLANEOUS) IMPLANT
SPONGE T-LAP 18X18 ~~LOC~~+RFID (SPONGE) ×1 IMPLANT
STAPLER SKIN PROX 35W (STAPLE) IMPLANT
STRAP SAFETY 5IN WIDE (MISCELLANEOUS) ×1 IMPLANT
SUT ETHIBOND 5-0 MS/4 CCS GRN (SUTURE) ×1
SUT FIBERWIRE #2 38 BLUE 1/2 (SUTURE) ×2
SUT MNCRL AB 4-0 PS2 18 (SUTURE) IMPLANT
SUT PROLENE 6 0 P 1 18 (SUTURE) IMPLANT
SUT TICRON 2-0 30IN 311381 (SUTURE) ×2 IMPLANT
SUT VIC AB 0 CT1 36 (SUTURE) ×1 IMPLANT
SUT VIC AB 2-0 CT2 27 (SUTURE) ×2 IMPLANT
SUT XBRAID 1.4 BLK/WHT (SUTURE) IMPLANT
SUT XBRAID 1.4 BLUE (SUTURE) IMPLANT
SUT XBRAID 1.4 WHITE/BLUE (SUTURE) IMPLANT
SUT XBRAID 2 BLACK/BLUE (SUTURE) IMPLANT
SUTURE ETHBND 5-0 MS/4 CCS GRN (SUTURE) ×1 IMPLANT
SUTURE FIBERWR #2 38 BLUE 1/2 (SUTURE) ×1 IMPLANT
SYR 30ML LL (SYRINGE) IMPLANT
TIP FAN IRRIG PULSAVAC PLUS (DISPOSABLE) ×1 IMPLANT
TRAP FLUID SMOKE EVACUATOR (MISCELLANEOUS) ×1 IMPLANT
WATER STERILE IRR 500ML POUR (IV SOLUTION) ×1 IMPLANT
WRAPON POLAR PAD SHDR XLG (MISCELLANEOUS) ×1

## 2023-08-30 NOTE — Discharge Instructions (Addendum)
 Trevor HILARIO Blanch, MD  [facility]  Phone: [phone]  Fax: [phone]   Discharge Instructions after Reverse Shoulder Replacement    1. Activity/Sling: You are to be non-weight bearing on operative extremity. A sling/shoulder immobilizer has been provided for you. Only remove the sling to perform elbow, wrist, and hand RoM exercises and hygiene/dressing. Active reaching and lifting are not permitted. You will be given further instructions on sling use at your first physical therapy visit and postoperative visit with Dr. Blanch.   2. Dressings: Dressing may be removed at 1st physical therapy visit (~3-4 days after surgery). Afterwards, you may either leave open to air (if no drainage) or cover with dry, sterile dressing. If you have steri-strips on your wound, please do not remove them. They will fall off on their own. You may shower 5 days after surgery. Please pat incision dry. Do not rub or place any shear forces across incision. If there is drainage or any opening of incision after 5 days, please notify our offices immediately.    3. Driving:  Plan on not driving for six weeks. Please note that you are advised NOT to drive while taking narcotic pain medications as you may be impaired and unsafe to drive.   4. Medications:  - You have been provided a prescription for narcotic pain medicine (usually oxycodone ). After surgery, take 1-2 narcotic tablets every 4 hours if needed for severe pain. Please start this as soon as you begin to start having pain (if you received a nerve block, start taking as soon as this wears off).  - A prescription for anti-nausea medication will be provided in case the narcotic medicine causes nausea - take 1 tablet every 6 hours only if nauseated.  - Take enteric coated aspirin  325 mg once daily for 6 weeks to prevent blood clots. Do not take aspirin  if you have an aspirin  sensitivity/allergy or asthma or are on an anticoagulant (blood thinner) already. If so, then  your home anticoagulant will be resume and managed - do not take aspirin . -Take tylenol  1000mg  (2 Extra strength or 3 regular strength tablets) every 8 hours for pain. This will reduce the amount of narcotic medication needed. May stop tylenol  when you are having minimal pain. - Take a stool softener (Colace, Dulcolax or Senakot) if you are using narcotic pain medications to help with constipation that is associated with narcotic use. - DO NOT take ANY nonsteroidal anti-inflammatory pain medications: Advil, Motrin, Ibuprofen, Aleve, Naproxen, or Naprosyn.   If you are taking prescription medication for anxiety, depression, insomnia, muscle spasm, chronic pain, or for attention deficit disorder you are advised that you are at a higher risk of adverse effects with use of narcotics post-op, including narcotic addiction/dependence, depressed breathing, death. If you use non-prescribed substances: alcohol, marijuana, cocaine, heroin, methamphetamines, etc., you are at a higher risk of adverse effects with use of narcotics post-op, including narcotic addiction/dependence, depressed breathing, death. You are advised that taking > 50 morphine milligram equivalents (MME) of narcotic pain medication per day results in twice the risk of overdose or death. For your prescription provided: oxycodone  5 mg - taking more than 6 tablets per day after the first few days of surgery.   5. Physical Therapy: 1-2 times per week for ~12 weeks. Therapy typically starts on post operative Day 3 or 4. You have been provided an order for physical therapy. The therapist will provide home exercises. Please contact our offices if this appointment has not been scheduled.  6. Work: May do light duty/desk job in approximately 2 weeks when off of narcotics, pain is well-controlled, and swelling has decreased if able to function with one arm in sling. Full work may take 6 weeks if light motions and function of both arms is required.  Lifting jobs may require 12 weeks.   7. Post-Op Appointments: Your first post-op appointment will be with Dr. Tobie in approximately 2 weeks time.    If you find that they have not been scheduled please call the Orthopaedic Appointment front desk at [phone].                               Trevor HILARIO Tobie, MD [facility] Phone: [phone] Fax: [phone]   REVERSE SHOULDER ARTHROPLASTY REHAB GUIDELINES   These guidelines should be tailored to individual patients based on their rehab goals, age, precautions, quality of repair, etc.  Progression should be based on patient progress and approval by the referring physician.  PHASE 1 - Day 1 through Week 2  GENERAL GUIDELINES AND PRECAUTIONS Sling wear 24/7 except during grooming and home exercises (3 to 5 times daily) Avoid shoulder extension such that the arm is posterior the frontal plane.  When patients recline, a pillow should be placed behind the upper arm and sling should be on.  They should be advised to always be able to see the elbow Avoid combined IR/ADD/EXT, such as hand behind back to prevent dislocation Avoid combined IR and ADD such as reaching across the chest to prevent dislocation No AROM No submersion in pool/water  for 4 weeks No weight bearing through operative arm (as in transfers, walker use, etc.)  GOALS Maintain integrity of joint replacement; protect soft tissue healing Increase PROM for elevation to 120 and ER to 30 (will remain the goal for first 6 weeks) Optimize distal UE circulation and muscle activity (elbow, wrist and hand) Instruct in use of sling for proper fit, polar care device for ice application after HEP, signs/symptoms of infection  EXERCISES Active elbow, wrist and hand Passive forward elevation in scapular plane to 90-120 max motion; ER in scapular plane to 30 Active scapular retraction with arms resting in neutral position  CRITERIA TO PROGRESS TO  PHASE 2 Low pain (less than 3/10) with shoulder PROM Healing of incision without signs of infection Clearance by MD to advance after 2 week MD check up  PHASE 2 - 2 weeks - 6 weeks  GENERAL GUIDELINES AND PRECAUTIONS Sling may be removed while at home; worn in community without abduction pillow May use arm for light activities of daily living (such as feeding, brushing teeth, dressing.) with elbow near  the side of the body  and arm in front of the body- no active lifting of the arm May submerge in water  (tub, pool, Jacuzzi, etc.) after 4 weeks Continue to avoid WBing through the operative arm Continue to avoid combined IR/EXT/ADD (hand behind the back) and IR/ADD  (reaching across chest) for dislocation precautions  GOALS  Achieve passive elevation to 120 and ER to 30  Low (less than 3/10) to no pain  Ability to fire all heads of the deltoid  EXERCISES May discontinue grip, and active elbow and wrist exercises since using the arm in ADL's  with sling removed around the home Continue passive elevation to 120 and ER to 30, both in scapular plane with arm supported on table top Add submaximal isometrics, pain free effort, for all  functional heads of deltoid (anterior, posterior, middle)  Ensure that with posterior deltoid isometric the shoulder does not move into extension and the arm remains anterior the frontal plane At 4 weeks:  begin to place arm in balanced position of 90 deg elevation in supine; when patient able to hold this position with ease, may begin reverse pendulums clockwise and counterclockwise  CRITERIA TO PROGRESS TO PHASE 3 Passive forward elevation in scapular plane to 120; passive ER in scapular plane to 30 Ability to fire isometrically all heads of the deltoid muscle without pain Ability to place and hold the arm in balanced position (90 deg elevation in supine)  PHASE 3 - 6 weeks to 3 months  GENERAL GUIDELINES AND PRECAUTIONS Discontinue use of sling Avoid  forcing end range motion in any direction to prevent dislocation  May advance use of the arm actively in ADL's without being restricted to arm by the side of the body, however, avoid heavy lifting and sports (forever!) May initiate functional IR behind the back gently NO UPPER BODY ERGOMETER   GOALS Optimize PROM for elevation and ER in scapular plane with realistic expectation that max  mobility for elevation is usually around 145-160 passively; ER 40 to 50 passively; functional IR to L1 Recover AROM to approach as close to PROM available as possible; may expect 135-150 deg active elevation; 30 deg active ER; active functional IR to L1 Establish dynamic stability of the shoulder with deltoid and periscapular muscle gradual strengthening  EXERCISES Forward elevation in scapular plane active progression: supine to incline, to vertical; short to long lever arm Balanced position long lever arm AROM Active ER/IR with arm at side Scapular retraction with light band resistance Functional IR with hand slide up back - very gentle and gradual NO UPPER BODY ERGOMETER     CRITERIA TO PROGRESS TO PHASE 4  AROM equals/approaches PROM with good mechanics for elevation   No pain  Higher level demand on shoulder than ADL functions   PHASE 4 12 months and beyond  GENERAL GUIDELINES AND PRECAUTIONS No heavy lifting and no overhead sports No heavy pushing activity Gradually increase strength of deltoid and scapular stabilizers; also the rotator cuff if present with weights not to exceed 5 lbs NO UPPER BODY ERGOMETER   GOALS  Optimize functional use of the operative UE to meet the desired demands  Gradual increase in deltoid, scapular muscle, and rotator cuff strength  Pain free functional activities   EXERCISES Add light hand weights for deltoid up to and not to exceed 3 lbs for anterior and posterior with long arm lift against gravity; elbow bent to 90 deg for abduction in scapular  plane Theraband progression for extension to hip with scapular depression/retraction Theraband progression for serratus anterior punches in supine; avoid wall, incline or prone pressups for serratus anterior End range stretching gently without forceful overpressure in all planes (elevation in scapular plane, ER in scapular plane, functional IR) with stretching done for life as part of a daily routine NO UPPER BODY ERGOMETER     CRITERIA FOR DISCHARGE FROM SKILLED PHYSICAL THERAPY  Pain free AROM for shoulder elevation (expect around 135-150)  Functional strength for all ADL's, work tasks, and hobbies approved by careers adviser  Independence with home maintenance program   NOTES: 1. With proper exercise, motion, strength, and function continue to improve even after one year. 2. The complication rate after surgery is 5 - 8%. Complications include infection, fracture, heterotopic bone formation, nerve injury, instability, rotator cuff  tear, and tuberosity nonunion. Please look for clinical signs, unusual symptoms, or lack of progress with therapy and report those to Dr. Tobie. Prefer more communication than less.  3. The therapy plan above only serves as a guide. Please be aware of specific individualized patient instructions as written on the prescription or through discussions with the surgeon. 4. Please call Dr. Tobie if you have any specific questions or concerns [phone]  Please leave teal colored wrist band on for 96hrs:  You received numbing medication in your incision   POLAR CARE INFORMATION  [email or web address]  How to use [employer] Polar Care [facility] Therapy System?  YouTube   [email or web address]  OPERATING INSTRUCTIONS  Start the product With dry hands, connect the transformer to the electrical connection located on the top of the cooler. Next, plug the transformer into an appropriate electrical outlet. The unit will automatically start running at this  point.  To stop the pump, disconnect electrical power.  Unplug to stop the product when not in use. Unplugging the Polar Care unit turns it off. Always unplug immediately after use. Never leave it plugged in while unattended. Remove pad.    FIRST ADD WATER  TO FILL LINE, THEN ICE---Replace ice when existing ice is almost melted  1 Discuss Treatment with your Licensed Health Care Practitioner and Use Only as Prescribed 2 Apply Insulation Barrier & Cold Therapy Pad 3 Check for Moisture 4 Inspect Skin Regularly  Tips and Trouble Shooting Usage Tips 1. Use cubed or chunked ice for optimal performance. 2. It is recommended to drain the Pad between uses. To drain the pad, hold the Pad upright with the hose pointed toward the ground. Depress the black plunger and allow water  to drain out. 3. You may disconnect the Pad from the unit without removing the pad from the affected area by depressing the silver tabs on the hose coupling and gently pulling the hoses apart. The Pad and unit will seal itself and will not leak. Note: Some dripping during release is normal. 4. DO NOT RUN PUMP WITHOUT WATER ! The pump in this unit is designed to run with water . Running the unit without water  will cause permanent damage to the pump. 5. Unplug unit before removing lid.  TROUBLESHOOTING GUIDE Pump not running, Water  not flowing to the pad, Pad is not getting cold 1. Make sure the transformer is plugged into the wall outlet. 2. Confirm that the ice and water  are filled to the indicated levels. 3. Make sure there are no kinks in the pad. 4. Gently pull on the blue tube to make sure the tube/pad junction is straight. 5. Remove the pad from the treatment site and ll it while the pad is lying at; then reapply. 6. Confirm that the pad couplings are securely attached to the unit. Listen for the double clicks (Figure 1) to confirm the pad couplings are securely attached.  Leaks    Note: Some condensation on the lines,  controller, and pads is unavoidable, especially in warmer climates. 1. If using a [employer] Polar Care Cold Therapy unit with a detachable Cold Therapy Pad, and a leak exists (other than condensation on the lines) disconnect the pad couplings. Make sure the silver tabs on the couplings are depressed before reconnecting the pad to the pump hose; then confirm both sides of the coupling are properly clicked in. 2. If the coupling continues to leak or a leak is detected in the pad itself, stop using it and call [employer] Customer Care at [phone].  Cleaning After use, empty and dry the unit with a soft cloth. Warm water  and mild detergent may be used occasionally to clean the pump and tubes.  WARNING: The Polar Care Cube can be cold enough to cause serious injury, including full skin necrosis. Follow these Operating Instructions, and carefully read the Product Insert (see pouch on side of unit) and the Cold Therapy Pad Fitting Instructions (provided with each Cold Therapy Pad) prior to use.

## 2023-08-30 NOTE — H&P (Signed)
 Paper H&P to be scanned into permanent record. H&P reviewed. No significant changes noted.

## 2023-08-30 NOTE — Anesthesia Procedure Notes (Signed)
 Procedure Name: Intubation Date/Time: 08/30/2023 9:19 AM  Performed by: Jackye Spanner, CRNAPre-anesthesia Checklist: Patient identified, Patient being monitored, Timeout performed, Emergency Drugs available and Suction available Patient Re-evaluated:Patient Re-evaluated prior to induction Oxygen Delivery Method: Circle system utilized Preoxygenation: Pre-oxygenation with 100% oxygen Induction Type: IV induction Ventilation: Mask ventilation without difficulty and Oral airway inserted - appropriate to patient size Laryngoscope Size: 3 and McGrath Grade View: Grade I Tube type: Oral Tube size: 7.0 mm Number of attempts: 1 Airway Equipment and Method: Stylet Placement Confirmation: ETT inserted through vocal cords under direct vision, positive ETCO2 and breath sounds checked- equal and bilateral Secured at: 22 cm Tube secured with: Tape Dental Injury: Teeth and Oropharynx as per pre-operative assessment  Comments: Smooth, atraumatic intubation. No complications noted.

## 2023-08-30 NOTE — Progress Notes (Signed)
 PT Cancellation Note  Patient Details Name: Trevor Neal MRN: [id number] DOB: 11/02/1960   Cancelled Treatment:    Reason Eval/Treat Not Completed: PT screened, no needs identified, will sign off (Per OT evaluation, pt performing all basic mobility to adequately and safely transition to home at this time. Pt has no acute skilled PT needs at this time, will sign off.)  3:44 PM, 08/30/23 Peggye JAYSON Linear, PT, DPT Physical Therapist - [facility]  [phone] (ASCOM)    Roark Rufo C 08/30/2023, 3:44 PM

## 2023-08-30 NOTE — Transfer of Care (Signed)
 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  Last Pain:  Vitals:   08/30/23 0731  TempSrc: Temporal  PainSc: 0-No pain      Patients Stated Pain Goal: 0 (08/30/23 0731)  Complications: No notable events documented.

## 2023-08-30 NOTE — Anesthesia Postprocedure Evaluation (Signed)
 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   Last Vitals:  Vitals:   08/30/23 1315 08/30/23 1330  BP:    Pulse:    Resp:  18  Temp:  36.4 C  SpO2: 92% 93%    Last Pain:  Vitals:   08/30/23 1245  TempSrc:   PainSc: 0-No pain                 Trevor Neal

## 2023-08-30 NOTE — Anesthesia Procedure Notes (Signed)
 Anesthesia Regional Block: Interscalene brachial plexus block   Pre-Anesthetic Checklist: , timeout performed,  Correct Patient, Correct Site, Correct Laterality,  Correct Procedure, Correct Position, site marked,  Risks and benefits discussed,  Surgical consent,  Pre-op evaluation,  At surgeon's request and post-op pain management  Laterality: Upper and Right  Prep: chloraprep       Needles:  Injection technique: Single-shot  Needle Type: Stimiplex     Needle Length: 9cm  Needle Gauge: 22     Additional Needles:   Procedures:,,,, ultrasound used (permanent image in chart),,    Narrative:  Start time: 08/30/2023 8:59 AM End time: 08/30/2023 9:01 AM Injection made incrementally with aspirations every 5 mL.  Performed by: Personally   Additional Notes: Patient consented for risk and benefits of nerve block including but not limited to nerve damage, Horner's syndrome, failed block, bleeding and infection.  Patient voiced understanding.  Functioning IV was confirmed and monitors were applied.  Timeout done prior to procedure and prior to any sedation being given to the patient.  Patient confirmed procedure site prior to any sedation given to the patient.  A 50mm 22ga Stimuplex needle was used. Sterile prep,hand hygiene and sterile gloves were used.  Minimal sedation used for procedure.  No paresthesia endorsed by patient during the procedure.  Negative aspiration and negative test dose prior to incremental administration of local anesthetic. The patient tolerated the procedure well with no immediate complications.

## 2023-08-30 NOTE — Op Note (Signed)
 Operative Note    SURGERY DATE: 08/30/2023   PRE-OP DIAGNOSIS:  1. 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  Right revision shoulder arthroplasty (conversion of anatomic shoulder arthroplasty to reverse shoulder arthroplasty)   SURGEON: Earnestine HILARIO Blanch, MD   ASSISTANT: DOROTHA Krystal Doyne, PA; Leita Kidd, PA-S   ANESTHESIA: Gen + interscalene block   ESTIMATED BLOOD LOSS: 150cc   TOTAL IV FLUIDS: See anesthesia record  SPECIMEN: Culture sample x 4 (no overt sign of infection)  IMPLANTS: Tornier: Perform plus reversed half wedge augment 25mm baseplate 35 degree with 35mm central screw; posterior compression screw, superior locking screw, inferior locking screw, and anterior locking screw to glenoid baseplate; 36 mm, +56mm offset eccentric glenosphere; retained size 4C standard humeral stem humeral stem; high ecc +3.5 reversed tray; standard reversed insert +6 mm   INDICATION(S):  Trevor Neal is a 63 y.o. male with a history of anatomic shoulder arthroplasty with 35 degree posteriorly augmented glenoid component on 01/17/2021.  He had a fall postoperatively and was noted to have a deficient subscapularis.  He underwent open rotator cuff repair of the supraspinatus and subscapularis on 05/28/2022.  Both surgeries were performed by Dr. Rexie at [facility].  He has had persistent pain and weakness with inability to reach behind his back or bring his hand to his abdomen since that surgery. Preoperative clinical exam and imaging was suggestive of subscapularis deficiency.  Conservative measures have not provided adequate relief. After discussion of risks, benefits, and alternatives to surgery, the patient elected to proceed with conversion to reverse shoulder arthroplasty.  OPERATIVE FINDINGS: Thin remnant of subscapularis still attached, intact superior rotator  cuff, biceps tendinopathy, no notable glenoid or humeral component loosening.   OPERATIVE REPORT:   I identified Trevor Neal in the pre-operative holding area. Informed consent was obtained and the surgical site was marked. I reviewed the risks and benefits of the proposed surgical intervention and the patient wished to proceed. An interscalene block with Exparel  was administered by the Anesthesia team. The patient was transferred to the operative suite and general anesthesia was administered. The patient was placed in the beach chair position with the head of the bed elevated approximately 45 degrees. All down side pressure points were appropriately padded. Appropriate IV antibiotics were administered. Tranexamic acid  was also administered after verifying that the patient had no contraindications. The extremity was then prepped and draped in standard fashion. A time out was performed confirming the correct extremity, correct patient, and correct procedure.   We used the previously utilized deltopectoral incision from the coracoid to ~12cm distal.  The cephalic vein was not identified in the interval.  We opened the deltopectoral interval widely and placed retractors under the CA ligament in the subacromial space and under the deltoid tendon at its insertion.  Adhesions about the deltoid and proximal humerus were released.   Next, we brought the arm back in adduction at slight forward flexion with external rotation.  There is significant scarring about the conjoined tendon and subscapularis.  The interval between the conjoined tendon subscapularis was dissected free carefully to avoid inadvertent axillary nerve injury.  The axillary nerve was not obviously palpated in this interval as the in a primary surgery setting.  The musculocutaneous nerve cannot be palpated either.  The quality of the subscapularis was relatively poor as it was quite thin.  It was attached to  the proximal humerus.  Next, we  performed a subscapularis peel, removing the subscapularis off of the lesser tuberosity.  Care was taken to stay on the bone to avoid axillary nerve injury.  When this was complete we gently dislocated the shoulder up into the wound.  The humeral head was then removed.  A broach handle was attached, and the humeral stem could not be turned or pulled out, suggesting excellent fixation of the humeral stem.  We then turned our attention to the glenoid.  The proximal humerus was retracted posteriorly. The augmented glenoid component was well-fixed.  An osteotome was used to make a horizontal cut in the glenoid component, which was then levered out.  The remainder of the glenoid was then grasped and also levered out.  Of note, 4 culture samples were taken (anterior humerus, bone/humeral head interface, superior glenoid tissue, and backside glenoid tissue.  There is no overt sign of infection.  Next, the edges of the glenoid were identified and gentle inferior release was performed.  During the glenoid exposure, the axillary nerve was protected the entire time.     A trial half wedge implant was placed.  This did achieve appropriate fixation with the augment posteriorly.  The central pin was drilled while holding the augment in an appropriate position. The high side was reamed until reaming marks reach the central guidepin.  The angled reamer was used to gently ream the augment side posteriorly.  Minimal reaming needed to be performed.  The half wedge glenoid trial was placed and locked into place with the augment posterior.  Standard instrumentation was carried out.  The baseplate with 6.5 mm central screw was placed.  This allowed for excellent compression of the baseplate to the glenoid with appropriate seating. Next the posterior compression screw was drilled and placed.  The superior, anterior, and inferior locking screws were also placed, but not final tightened.  The compression screw was then completely seated  and excellent purchase was achieved.  The locking screws were then tightened.  The peripheral reamer was used to ensure appropriate glenosphere seating.  Next, the glenosphere was impacted into place such that the eccentric portion allowed for more inferior placement, and central screw of the glenosphere was tightened.  We then turned our attention back to the humerus.  The trial poly was placed and the humerus was reduced.  The shoulder was trialed and noted to have satisfactory stability, motion, and deltoid tension with a standard +6 mm poly.  The trial implants were removed. Humeral tray and poly insert were placed.  2 FiberWire sutures were passed around the humeral tray.  The humerus was reduced and motion, tension, and stability were satisfactory. A Hemovac drain was placed.  Remnants of the subscapularis tissue were repaired with the previously passed #2 FiberWire sutures after passing them through the subscapularis.  Ticron suture was used to close the lateral aspect of the rotator interval.  We verified appropriate range of motion, stability of the implant, and security of the subscapularis repair.  The wound was thoroughly irrigated with normal saline, and Irrisept solution was used to soak the wound bed.  Vancomycin  powder was also placed.  Next, we closed the deltopectoral interval a running, 0-Vicryl suture. The skin was closed with 2-0 Vicryl and staples. Xeroform and Honeycomb dressing was applied. A PolarCare unit and sling were placed. Patient was extubated, transferred to a stretcher bed and to the post antesthesia care unit in stable condition.   Of note, assistance from  a PA was essential to performing the surgery.  PA was present for the entire surgery.  PA assisted with patient positioning, retraction, instrumentation, and wound closure. The surgery would have been more difficult and had longer operative time without PA assistance.   POSTOPERATIVE PLAN: The patient will be admitted  with plan for discharge home on POD#1. Operative arm to remain in sling at all times except RoM exercises and hygiene. Can perform pendulums, elbow/wrist/hand RoM exercises. Passive RoM allowed to 90 FF and 30 ER. ASA 325mg  x 6 weeks for DVT ppx. Plan for outpatient PT starting on POD #3-4. Patient to return to clinic in ~2 weeks for post-operative appointment.

## 2023-08-30 NOTE — Anesthesia Preprocedure Evaluation (Signed)
 Anesthesia Evaluation  Patient identified by MRN, date of birth, ID band Patient awake    Reviewed: Allergy & Precautions, NPO status , Patient's Chart, lab work & pertinent test results  History of Anesthesia Complications Negative for: history of anesthetic complications  Airway Mallampati: III  TM Distance: >3 FB Neck ROM: full    Dental  (+) Chipped, Poor Dentition   Pulmonary neg pulmonary ROS, neg shortness of breath   Pulmonary exam normal        Cardiovascular Exercise Tolerance: Good hypertension, (-) angina (-) Past MI and (-) DOE Normal cardiovascular exam     Neuro/Psych negative neurological ROS  negative psych ROS   GI/Hepatic Neg liver ROS,GERD  Controlled,,  Endo/Other  negative endocrine ROS    Renal/GU      Musculoskeletal   Abdominal   Peds  Hematology negative hematology ROS (+)   Anesthesia Other Findings Past Medical History: No date: Cancer (HCC)     Comment:  basal cell-face No date: Foot drop     Comment:  chronic-PT USES BRACE ON RIGHT LEG No date: Footdrop     Comment:  chronic No date: GERD (gastroesophageal reflux disease) No date: Hyperlipidemia No date: Hypertension     Comment:  has not been taking bp meds  Past Surgical History: 2008 2012: BACK SURGERY     Comment:  x5 04/04/2015: COLONOSCOPY WITH PROPOFOL ; N/A     Comment:  Procedure: COLONOSCOPY WITH PROPOFOL ;  Surgeon: Lamar ONEIDA Holmes, MD;  Location: [facility] ENDOSCOPY;  Service:               Endoscopy;  Laterality: N/A; 2006: foot drop surgery 03/19/2016: VENTRAL HERNIA REPAIR; N/A     Comment:  Procedure: HERNIA REPAIR VENTRAL ADULT;  Surgeon: Larinda Unknown Sharps, MD;  Location: [facility] ORS;  Service: General;              Laterality: N/A;  BMI    Body Mass Index: 31.15 kg/m      Reproductive/Obstetrics negative OB ROS                             Anesthesia  Physical Anesthesia Plan  ASA: 2  Anesthesia Plan: General ETT   Post-op Pain Management: Regional block*   Induction: Intravenous  PONV Risk Score and Plan: Ondansetron , Dexamethasone , Midazolam  and Treatment may vary due to age or medical condition  Airway Management Planned: Oral ETT  Additional Equipment:   Intra-op Plan:   Post-operative Plan: Extubation in OR  Informed Consent: I have reviewed the patients History and Physical, chart, labs and discussed the procedure including the risks, benefits and alternatives for the proposed anesthesia with the patient or authorized representative who has indicated his/her understanding and acceptance.     Dental Advisory Given  Plan Discussed with: Anesthesiologist, CRNA and Surgeon  Anesthesia Plan Comments: (Patient consented for risks of anesthesia including but not limited to:  - adverse reactions to medications - damage to eyes, teeth, lips or other oral mucosa - nerve damage due to positioning  - sore throat or hoarseness - Damage to heart, brain, nerves, lungs, other parts of body or loss of life  Patient voiced understanding and assent.)       Anesthesia Quick Evaluation

## 2023-08-30 NOTE — Evaluation (Signed)
 Occupational Therapy Evaluation Patient Details Name: Trevor Neal MRN: [id number] DOB: 07/07/1961 Today's Date: 08/30/2023   History of Present Illness Pt is a 63 year old male s/p Right revision shoulder arthroplasty (conversion of anatomic shoulder arthroplasty to reverse shoulder arthroplasty)   Clinical Impression   Chart reviewed, pt greeted in chair, agreeable to OT evaluation. Pt is alert and oriented x4, wife present for education throughout. PTA pt is indep in ADL/IADL, amb with no AD. Pt and wife provided education via hand out and demo re: polar care mgt, compression stockings mgt, sling/immobilizer mgt, ROM exercises for RUE, RUE precautions, adaptive strategies for bathing/dressing/toileting/grooming, positioning and considerations for sleep, and home/routines modifications to maximize falls prevention, safety, and independence. Handout provided. OT adjusted sling/immobilizer and polar care to improve comfort, optimize positioning, and to maximize skin integrity/safety. MOD A required for LB dressing, MAX A for UB dressing with wife assisting appropriate. Pt amb approx 100' with CGA, progressing to supervision. Pt an dwife verbalized understanding of all education/training provided. Pt will benefit from skilled OT services to address these limitations and improve independence in daily tasks. Follow up therapy per MD protocol.       If plan is discharge home, recommend the following: A little help with bathing/dressing/bathroom;Assistance with cooking/housework    Functional Status Assessment  Patient has had a recent decline in their functional status and demonstrates the ability to make significant improvements in function in a reasonable and predictable amount of time.  Equipment Recommendations  None recommended by OT    Recommendations for Other Services       Precautions / Restrictions Precautions Precautions: Fall;Shoulder Shoulder Interventions: Shoulder  sling/immobilizer;At all times;Off for dressing/bathing/exercises Required Braces or Orthoses: Sling Restrictions Weight Bearing Restrictions Per Provider Order: Yes RUE Weight Bearing Per Provider Order: Non weight bearing Other Position/Activity Restrictions: Operative arm to remain in sling at all times except RoM exercises and hygiene. Can perform pendulums, elbow/wrist/hand RoM exercises. Passive RoM allowed to 90 FF and 30 ER.      Mobility Bed Mobility               General bed mobility comments: NT in recliner pre/post session; pt reports he will sleep in recliner in the short term    Transfers Overall transfer level: Needs assistance   Transfers: Sit to/from Stand Sit to Stand: Supervision                  Balance Overall balance assessment: Needs assistance Sitting-balance support: Feet supported Sitting balance-Leahy Scale: Normal     Standing balance support: No upper extremity supported Standing balance-Leahy Scale: Good                             ADL either performed or assessed with clinical judgement   ADL Overall ADL's : Needs assistance/impaired                 Upper Body Dressing : Maximal assistance Upper Body Dressing Details (indicate cue type and reason): MAX A, caregiver able to assist with verbal and tactile cues, doff/donning sling, donning shirt; Lower Body Dressing: Moderate assistance;Sit to/from stand Lower Body Dressing Details (indicate cue type and reason): socks, shoes, AFO, underwear, pants, wife assised approrpiately Toilet Transfer: Supervision/safety;Contact guard assist Toilet Transfer Details (indicate cue type and reason): simulated         Functional mobility during ADLs: Supervision/safety;Contact guard assist (approx 100' with no AD, progress  to supervision) General ADL Comments: wife will assist with ADL PRN per report     Vision Patient Visual Report: No change from baseline        Perception         Praxis         Pertinent Vitals/Pain Pain Assessment Pain Assessment: No/denies pain     Extremity/Trunk Assessment Upper Extremity Assessment Upper Extremity Assessment: Right hand dominant;RUE deficits/detail RUE Deficits / Details: shoudler NT; no AROM throughout R elbow, forearm, wrist, hand yet; instructed on PROM RUE Sensation: decreased light touch RUE Coordination: decreased fine motor   Lower Extremity Assessment Lower Extremity Assessment: Overall WFL for tasks assessed   Cervical / Trunk Assessment Cervical / Trunk Assessment: Normal   Communication Communication Communication: No apparent difficulties Cueing Techniques: Verbal cues;Visual cues   Cognition Arousal: Alert Behavior During Therapy: WFL for tasks assessed/performed Overall Cognitive Status: Within Functional Limits for tasks assessed                                       General Comments  vss throughout, incision intact pre/post session; MD in room to pull drain mid session;    Exercises     Shoulder Instructions      Home Living Family/patient expects to be discharged to:: Private residence Living Arrangements: Spouse/significant other Available Help at Discharge: Family;Available 24 hours/day (wife taking off a few days) Type of Home: House Home Access: Stairs to enter [employer] of Steps: 2   Home Layout: Two level Alternate Level Stairs-Number of Steps: 7, landing, then 8 more; rails on left for first set, going up on R second set ; pt plans to sleep in a recliner on first floor intitally   Bathroom Shower/Tub: Producer, Television/film/video: Standard     Home Equipment: None          Prior Functioning/Environment Prior Level of Function : Independent/Modified Independent;Working/employed;Driving             Mobility Comments: does wear R AFO due to foot drop ADLs Comments: indep in ADL/IADL        OT Problem  List: Decreased activity tolerance;Decreased strength      OT Treatment/Interventions: Self-care/ADL training;Therapeutic exercise;DME and/or AE instruction;Therapeutic activities;Balance training    OT Goals(Current goals can be found in the care plan section) Acute Rehab OT Goals Patient Stated Goal: go home OT Goal Formulation: With patient Time For Goal Achievement: 09/13/23 Potential to Achieve Goals: Good ADL Goals Pt Will Perform Grooming: with modified independence Pt Will Perform Upper Body Dressing: with modified independence;with caregiver independent in assisting Pt Will Transfer to Toilet: with modified independence;ambulating Pt Will Perform Toileting - Clothing Manipulation and hygiene: with modified independence;sit to/from stand;sitting/lateral leans  OT Frequency: Min 1X/week    Co-evaluation              AM-PAC OT 6 Clicks Daily Activity     Outcome Measure Help from another person eating meals?: None Help from another person taking care of personal grooming?: None Help from another person toileting, which includes using toliet, bedpan, or urinal?: None Help from another person bathing (including washing, rinsing, drying)?: A Little Help from another person to put on and taking off regular upper body clothing?: A Lot Help from another person to put on and taking off regular lower body clothing?: A Lot 6 Click Score: 19  End of Session Equipment Utilized During Treatment: Gait belt Nurse Communication: Mobility status  Activity Tolerance: Patient tolerated treatment well Patient left: in chair;with call bell/phone within reach  OT Visit Diagnosis: Other abnormalities of gait and mobility (R26.89)                Time: 8486-8452 OT Time Calculation (min): 34 min Charges:  OT General Charges $OT Visit: 1 Visit OT Evaluation $OT Eval Low Complexity: 1 Low  Therisa Sheffield, OTD OTR/L  08/30/23, 4:10 PM

## 2023-08-31 ENCOUNTER — Encounter: Payer: Self-pay | Admitting: Orthopedic Surgery

## 2023-09-05 ENCOUNTER — Encounter: Payer: Self-pay | Admitting: Orthopedic Surgery

## 2023-09-06 LAB — AEROBIC/ANAEROBIC CULTURE W GRAM STAIN (SURGICAL/DEEP WOUND): Gram Stain: NONE SEEN

## 2023-09-19 ENCOUNTER — Other Ambulatory Visit
Admission: RE | Admit: 2023-09-19 | Discharge: 2023-09-19 | Disposition: A | Discharge status: Home / Self Care | Payer: Commercial Managed Care - PPO | Source: Ambulatory Visit | Attending: Sports Medicine | Admitting: Sports Medicine

## 2023-09-19 DIAGNOSIS — L03113 Cellulitis of right upper limb: Secondary | ICD-10-CM | POA: Insufficient documentation

## 2023-09-19 DIAGNOSIS — R229 Localized swelling, mass and lump, unspecified: Secondary | ICD-10-CM | POA: Insufficient documentation

## 2023-09-19 LAB — SYNOVIAL CELL COUNT + DIFF, W/ CRYSTALS: Crystals, Fluid: NONE SEEN

## 2023-09-20 ENCOUNTER — Ambulatory Visit: Payer: Commercial Managed Care - PPO | Admitting: Infectious Diseases

## 2023-09-20 LAB — AEROBIC/ANAEROBIC CULTURE W GRAM STAIN (SURGICAL/DEEP WOUND)
Culture: NO GROWTH
Gram Stain: NONE SEEN
Gram Stain: NONE SEEN
Gram Stain: NONE SEEN

## 2023-09-23 LAB — BODY FLUID CULTURE W GRAM STAIN: Culture: NO GROWTH

## 2023-09-29 ENCOUNTER — Emergency Department
Admission: EM | Admit: 2023-09-29 | Discharge: 2023-09-29 | Discharge status: Against Medical Advice | Payer: Commercial Managed Care - PPO | Attending: Emergency Medicine | Admitting: Emergency Medicine

## 2023-09-29 ENCOUNTER — Other Ambulatory Visit: Payer: Self-pay

## 2023-09-29 ENCOUNTER — Encounter: Payer: Self-pay | Admitting: Emergency Medicine

## 2023-09-29 DIAGNOSIS — R55 Syncope and collapse: Secondary | ICD-10-CM | POA: Diagnosis present

## 2023-09-29 DIAGNOSIS — Z5321 Procedure and treatment not carried out due to patient leaving prior to being seen by health care provider: Secondary | ICD-10-CM | POA: Insufficient documentation

## 2023-09-29 LAB — URINALYSIS, ROUTINE W REFLEX MICROSCOPIC
Bilirubin Urine: NEGATIVE
Glucose, UA: NEGATIVE mg/dL
Ketones, ur: NEGATIVE mg/dL
Leukocytes,Ua: NEGATIVE
Nitrite: NEGATIVE
Protein, ur: NEGATIVE mg/dL
Specific Gravity, Urine: 1.017 (ref 1.005–1.030)
Squamous Epithelial / HPF: 0 /[HPF] (ref 0–5)
pH: 5 (ref 5.0–8.0)

## 2023-09-29 LAB — CBC
HCT: 45.4 % (ref 39.0–52.0)
Hemoglobin: 14.9 g/dL (ref 13.0–17.0)
MCH: 30.8 pg (ref 26.0–34.0)
MCHC: 32.8 g/dL (ref 30.0–36.0)
MCV: 93.8 fL (ref 80.0–100.0)
Platelets: 199 10*3/uL (ref 150–400)
RBC: 4.84 MIL/uL (ref 4.22–5.81)
RDW: 12.6 % (ref 11.5–15.5)
WBC: 13.8 10*3/uL — ABNORMAL HIGH (ref 4.0–10.5)
nRBC: 0 % (ref 0.0–0.2)

## 2023-09-29 NOTE — ED Notes (Addendum)
Patient's wife came up to the desk to inquire about the wait. I did inform her that I would be unable to give a wait time and she informed me that since his blood pressure was higher, and the ER was so busy that she would take patient in the AM to see his Primary Dr. I explained to both patient and his wife that I could not give any advice or say that it was safe to leave without being evaluated by a Physician. They collectively decided to leave and did state that they understood that if patient began to experience any chest pain, SOB, dizziness, numbness, tingling, pre-syncope, or any other concerning symptoms, that they should call 911 immediately. Patient and wife verbalized this understanding.

## 2023-09-29 NOTE — ED Triage Notes (Signed)
Patient BIB [facility] from home w/ c/o near syncopal episode when he stood up tonight at 7pm. No LOC but felt immediately light headed, got clammy, but was able to sit back down with no trauma. Denies chest pain, shortness of breath. Recent shoulder surgery to the R side 4 weeks ago with normal recovery so far. Patient recently finished abx-amoxicillin. Eating and drinking ok per patient. Aox4.

## 2023-11-22 ENCOUNTER — Ambulatory Visit: Admit: 2023-11-22 | Payer: Commercial Managed Care - PPO

## 2023-11-22 SURGERY — COLONOSCOPY WITH PROPOFOL
Anesthesia: General

## 2024-01-03 ENCOUNTER — Encounter (INDEPENDENT_AMBULATORY_CARE_PROVIDER_SITE_OTHER): Payer: Self-pay

## 2024-01-31 ENCOUNTER — Ambulatory Visit
Admission: RE | Admit: 2024-01-31 | Discharge: 2024-01-31 | Disposition: A | Discharge status: Home / Self Care | Attending: Gastroenterology | Admitting: Gastroenterology

## 2024-01-31 ENCOUNTER — Encounter
Admission: RE | Disposition: A | Discharge status: Home / Self Care | Payer: Self-pay | Source: Home / Self Care | Attending: Gastroenterology

## 2024-01-31 ENCOUNTER — Ambulatory Visit: Admitting: Anesthesiology

## 2024-01-31 ENCOUNTER — Other Ambulatory Visit: Payer: Self-pay

## 2024-01-31 DIAGNOSIS — Z1211 Encounter for screening for malignant neoplasm of colon: Secondary | ICD-10-CM | POA: Diagnosis present

## 2024-01-31 DIAGNOSIS — K5730 Diverticulosis of large intestine without perforation or abscess without bleeding: Secondary | ICD-10-CM | POA: Insufficient documentation

## 2024-01-31 DIAGNOSIS — I10 Essential (primary) hypertension: Secondary | ICD-10-CM | POA: Diagnosis not present

## 2024-01-31 DIAGNOSIS — K640 First degree hemorrhoids: Secondary | ICD-10-CM | POA: Diagnosis not present

## 2024-01-31 DIAGNOSIS — E785 Hyperlipidemia, unspecified: Secondary | ICD-10-CM | POA: Insufficient documentation

## 2024-01-31 HISTORY — PX: COLONOSCOPY: SHX5424

## 2024-01-31 SURGERY — COLONOSCOPY
Anesthesia: General

## 2024-01-31 MED ORDER — PROPOFOL 500 MG/50ML IV EMUL
INTRAVENOUS | Status: DC | PRN
  Administered 2024-01-31: 165 ug/kg/min via INTRAVENOUS

## 2024-01-31 MED ORDER — LIDOCAINE HCL (CARDIAC) PF 100 MG/5ML IV SOSY
PREFILLED_SYRINGE | INTRAVENOUS | Status: DC | PRN
Start: 2024-01-31 — End: 2024-01-31
  Administered 2024-01-31: 100 mg via INTRAVENOUS

## 2024-01-31 MED ORDER — SODIUM CHLORIDE 0.9 % IV SOLN
INTRAVENOUS | Status: DC
  Administered 2024-01-31: 1000 mL via INTRAVENOUS

## 2024-01-31 MED ORDER — SODIUM CHLORIDE 0.9 % IV SOLN: INTRAVENOUS | Status: DC

## 2024-01-31 MED ORDER — PROPOFOL 10 MG/ML IV BOLUS
INTRAVENOUS | Status: DC | PRN
  Administered 2024-01-31: 70 mg via INTRAVENOUS

## 2024-01-31 MED ORDER — DEXMEDETOMIDINE HCL IN NACL 200 MCG/50ML IV SOLN
INTRAVENOUS | Status: DC | PRN
Start: 2024-01-31 — End: 2024-01-31
  Administered 2024-01-31: 12 ug via INTRAVENOUS

## 2024-01-31 NOTE — Anesthesia Preprocedure Evaluation (Signed)
 Anesthesia Evaluation  Patient identified by MRN, date of birth, ID band Patient awake    Reviewed: Allergy & Precautions, NPO status , Patient's Chart, lab work & pertinent test results  History of Anesthesia Complications Negative for: history of anesthetic complications  Airway Mallampati: II  TM Distance: >3 FB Neck ROM: Full    Dental no notable dental hx. (+) Teeth Intact   Pulmonary neg pulmonary ROS, neg sleep apnea, neg COPD, Patient abstained from smoking.Not current smoker   Pulmonary exam normal breath sounds clear to auscultation       Cardiovascular Exercise Tolerance: Good METShypertension, Pt. on medications (-) CAD and (-) Past MI (-) dysrhythmias  Rhythm:Regular Rate:Normal - Systolic murmurs    Neuro/Psych negative neurological ROS  negative psych ROS   GI/Hepatic ,GERD  Medicated and Controlled,,(+)     substance abuse  alcohol use6-7 drinks per week   Endo/Other  neg diabetes    Renal/GU negative Renal ROS     Musculoskeletal   Abdominal   Peds  Hematology   Anesthesia Other Findings Past Medical History: No date: Cancer (HCC)     Comment:  basal cell-face No date: Foot drop     Comment:  chronic-PT USES BRACE ON RIGHT LEG No date: Footdrop     Comment:  chronic No date: GERD (gastroesophageal reflux disease) No date: Hyperlipidemia No date: Hypertension     Comment:  has not been taking bp meds  Reproductive/Obstetrics                             Anesthesia Physical Anesthesia Plan  ASA: 2  Anesthesia Plan: General   Post-op Pain Management: Minimal or no pain anticipated   Induction: Intravenous  PONV Risk Score and Plan: 2 and Propofol  infusion, TIVA and Ondansetron   Airway Management Planned: Nasal Cannula  Additional Equipment: None  Intra-op Plan:   Post-operative Plan:   Informed Consent: I have reviewed the patients History and  Physical, chart, labs and discussed the procedure including the risks, benefits and alternatives for the proposed anesthesia with the patient or authorized representative who has indicated his/her understanding and acceptance.     Dental advisory given  Plan Discussed with: CRNA and Surgeon  Anesthesia Plan Comments: (Discussed risks of anesthesia with patient, including possibility of difficulty with spontaneous ventilation under anesthesia necessitating airway intervention, PONV, and rare risks such as cardiac or respiratory or neurological events, and allergic reactions. Discussed the role of CRNA in patient's perioperative care. Patient understands.)       Anesthesia Quick Evaluation

## 2024-01-31 NOTE — H&P (Signed)
 Outpatient short stay form Pre-procedure 01/31/2024  Shane Darling, MD  Primary Physician: Sari Cunning, MD  Reason for visit:  Surveillance  History of present illness:    63 y/o gentleman with history of hypertension, hyperlipidemia, and colon polyps here for surveillance colonoscopy. Last colonoscopy in 2019 was unremarkable. No blood thinners. No family history of GI malignancies. No significant abdominal surgeries.    Current Facility-Administered Medications:    0.9 %  sodium chloride  infusion, , Intravenous, Continuous, Alla Sloma, Leanora Prophet, MD   0.9 %  sodium chloride  infusion, , Intravenous, Continuous, Lattie Poli, MD, Last Rate: 20 mL/hr at 01/31/24 1201, 1,000 mL at 01/31/24 1201  Medications Prior to Admission  Medication Sig Dispense Refill Last Dose/Taking   acetaminophen  (TYLENOL ) 500 MG tablet Take 2 tablets (1,000 mg total) by mouth every 8 (eight) hours. 90 tablet 2 Past Month   Cyanocobalamin (VITAMIN B-12 PO) Take 1 tablet by mouth once a week.   Past Month   fluticasone (FLONASE) 50 MCG/ACT nasal spray Place 2 sprays into both nostrils daily as needed for allergies. (Patient not taking: Reported on 01/31/2024)   Not Taking   olmesartan (BENICAR) 20 MG tablet Take 20 mg by mouth in the morning.   01/29/2024   ondansetron  (ZOFRAN -ODT) 4 MG disintegrating tablet Take 1 tablet (4 mg total) by mouth every 8 (eight) hours as needed for nausea or vomiting. (Patient not taking: Reported on 01/31/2024) 20 tablet 0 Not Taking   oxyCODONE  (ROXICODONE ) 5 MG immediate release tablet Take 1-2 tablets (5-10 mg total) by mouth every 4 (four) hours as needed (pain). (Patient not taking: Reported on 01/31/2024) 30 tablet 0 Not Taking   pantoprazole (PROTONIX) 40 MG tablet Take 40 mg by mouth every evening.   01/29/2024   rosuvastatin (CRESTOR) 10 MG tablet Take 10 mg by mouth at bedtime.   01/29/2024     No Known Allergies   Past Medical History:  Diagnosis Date   Cancer (HCC)     basal cell-face   Foot drop    chronic-PT USES BRACE ON RIGHT LEG   Footdrop    chronic   GERD (gastroesophageal reflux disease)    Hyperlipidemia    Hypertension    has not been taking bp meds    Review of systems:  Otherwise negative.    Physical Exam  Gen: Alert, oriented. Appears stated age.  HEENT: PERRLA. Lungs: No respiratory distress CV: RRR Abd: soft, benign, no masses Ext: No edema    Planned procedures: Proceed with colonoscopy. The patient understands the nature of the planned procedure, indications, risks, alternatives and potential complications including but not limited to bleeding, infection, perforation, damage to internal organs and possible oversedation/side effects from anesthesia. The patient agrees and gives consent to proceed.  Please refer to procedure notes for findings, recommendations and patient disposition/instructions.     Shane Darling, MD [facility]

## 2024-01-31 NOTE — Anesthesia Postprocedure Evaluation (Signed)
 Anesthesia Post Note  Patient: Trevor Neal  Procedure(s) Performed: COLONOSCOPY  Patient location during evaluation: Endoscopy Anesthesia Type: General Level of consciousness: awake and alert Pain management: pain level controlled Vital Signs Assessment: post-procedure vital signs reviewed and stable Respiratory status: spontaneous breathing, nonlabored ventilation, respiratory function stable and patient connected to nasal cannula oxygen Cardiovascular status: blood pressure returned to baseline and stable Postop Assessment: no apparent nausea or vomiting Anesthetic complications: no  No notable events documented.   Last Vitals:  Vitals:   01/31/24 1402 01/31/24 1408  BP: (!) 139/97 (!) 141/100  Pulse: (!) 59 (!) 57  Resp: 16 (!) 21  Temp:    SpO2: 96% 97%    Last Pain:  Vitals:   01/31/24 1408  TempSrc:   PainSc: 0-No pain                 Enrique Harvest

## 2024-01-31 NOTE — Transfer of Care (Signed)
 Immediate Anesthesia Transfer of Care Note  Patient: Trevor Neal  Procedure(s) Performed: COLONOSCOPY  Patient Location: Endoscopy Unit  Anesthesia Type:General  Level of Consciousness: drowsy and patient cooperative  Airway & Oxygen Therapy: Patient Spontanous Breathing and Patient connected to face mask oxygen  Post-op Assessment: Report given to RN and Post -op Vital signs reviewed and stable  Post vital signs: Reviewed and stable  Last Vitals:  Vitals Value Taken Time  BP 88/53 01/31/24 13:32  Temp 35.7 C 01/31/24 13:32  Pulse 61 01/31/24 13:36  Resp 36 01/31/24 13:36  SpO2 99 % 01/31/24 13:36  Vitals shown include unfiled device data.  Last Pain:  Vitals:   01/31/24 1332  TempSrc: Temporal  PainSc: Asleep      Patients Stated Pain Goal: 8 (01/31/24 1147)  Complications: No notable events documented.

## 2024-01-31 NOTE — Anesthesia Procedure Notes (Signed)
 Procedure Name: General with mask airway Date/Time: 01/31/2024 1:14 PM  Performed by: Niki Barter, CRNAPre-anesthesia Checklist: Patient identified, Emergency Drugs available, Suction available and Patient being monitored Patient Re-evaluated:Patient Re-evaluated prior to induction Oxygen Delivery Method: Simple face mask Induction Type: IV induction Placement Confirmation: positive ETCO2 and breath sounds checked- equal and bilateral Dental Injury: Teeth and Oropharynx as per pre-operative assessment

## 2024-01-31 NOTE — Interval H&P Note (Signed)
 History and Physical Interval Note:  01/31/2024 1:07 PM  Trevor Neal  has presented today for surgery, with the diagnosis of Personal history of adenomatous and serrated colon polyps (Z86.0101).  The various methods of treatment have been discussed with the patient and family. After consideration of risks, benefits and other options for treatment, the patient has consented to  Procedure(s): COLONOSCOPY (N/A) as a surgical intervention.  The patient's history has been reviewed, patient examined, no change in status, stable for surgery.  I have reviewed the patient's chart and labs.  Questions were answered to the patient's satisfaction.     Shane Darling  Ok to proceed with colonoscopy

## 2024-01-31 NOTE — Op Note (Signed)
 [facility] Patient Name: Trevor Neal Procedure Date: 01/31/2024 12:49 PM MRN: [id number] Account #: [id number] Date of Birth: 28-May-1961 Admit Type: Outpatient Age: 63 Room: [facility] ENDO ROOM 1 Gender: Male Note Status: Finalized Instrument Name: Charlyn Cooley [id number] Procedure:             Colonoscopy Indications:           High risk colon cancer surveillance: Personal history                         of colonic polyps, Last colonoscopy 5 years ago Providers:             Leida Puna MD, MD Medicines:             Monitored Anesthesia Care Complications:         No immediate complications. Procedure:             Pre-Anesthesia Assessment:                        - Prior to the procedure, a History and Physical was                         performed, and patient medications and allergies were                         reviewed. The patient is competent. The risks and                         benefits of the procedure and the sedation options and                         risks were discussed with the patient. All questions                         were answered and informed consent was obtained.                         Patient identification and proposed procedure were                         verified by the physician, the nurse, the                         anesthesiologist, the anesthetist and the technician                         in the endoscopy suite. Mental Status Examination:                         alert and oriented. Airway Examination: normal                         oropharyngeal airway and neck mobility. Respiratory                         Examination: clear to auscultation. CV Examination:                         normal. Prophylactic Antibiotics: The patient does  not                         require prophylactic antibiotics. Prior                         Anticoagulants: The patient has taken no anticoagulant                         or  antiplatelet agents. ASA Grade Assessment: II - A                         patient with mild systemic disease. After reviewing                         the risks and benefits, the patient was deemed in                         satisfactory condition to undergo the procedure. The                         anesthesia plan was to use monitored anesthesia care                         (MAC). Immediately prior to administration of                         medications, the patient was re-assessed for adequacy                         to receive sedatives. The heart rate, respiratory                         rate, oxygen saturations, blood pressure, adequacy of                         pulmonary ventilation, and response to care were                         monitored throughout the procedure. The physical                         status of the patient was re-assessed after the                         procedure.                        After obtaining informed consent, the colonoscope was                         passed under direct vision. Throughout the procedure,                         the patient's blood pressure, pulse, and oxygen                         saturations were monitored continuously. The  Colonoscope was introduced through the anus and                         advanced to the the terminal ileum, with                         identification of the appendiceal orifice and IC                         valve. The terminal ileum, ileocecal valve,                         appendiceal orifice, and rectum were photographed. Findings:      The perianal and digital rectal examinations were normal.      The terminal ileum appeared normal.      Multiple small-mouthed diverticula were found in the sigmoid colon.      Internal hemorrhoids were found during retroflexion. The hemorrhoids       were Grade I (internal hemorrhoids that do not prolapse).      The exam was otherwise without  abnormality on direct and retroflexion       views. Impression:            - The examined portion of the ileum was normal.                        - Diverticulosis in the sigmoid colon.                        - Internal hemorrhoids.                        - The examination was otherwise normal on direct and                         retroflexion views.                        - No specimens collected. Recommendation:        - Discharge patient to home.                        - Resume previous diet.                        - Continue present medications.                        - Repeat colonoscopy in 10 years for surveillance.                        - Return to referring physician as previously                         scheduled. Procedure Code(s):     --- Professional ---                        [phone], Colonoscopy, flexible; diagnostic, including                         collection of specimen(s) by brushing or washing,  when                         performed (separate procedure) Diagnosis Code(s):     --- Professional ---                        Z86.010, Personal history of colonic polyps                        K64.0, First degree hemorrhoids                        K57.30, Diverticulosis of large intestine without                         perforation or abscess without bleeding CPT copyright 2022 [employer]. All rights reserved. The codes documented in this report are preliminary and upon coder review may  be revised to meet current compliance requirements. Leida Puna MD, MD 01/31/2024 1:34:01 PM Number of Addenda: 0 Note Initiated On: 01/31/2024 12:49 PM Scope Withdrawal Time: 0 hours 6 minutes 17 seconds  Total Procedure Duration: 0 hours 12 minutes 12 seconds  Estimated Blood Loss:  Estimated blood loss: none.      [facility]

## 2024-02-29 ENCOUNTER — Ambulatory Visit
Admission: RE | Admit: 2024-02-29 | Discharge: 2024-02-29 | Disposition: A | Discharge status: Home / Self Care | Attending: Internal Medicine | Admitting: Internal Medicine

## 2024-02-29 ENCOUNTER — Other Ambulatory Visit: Payer: Self-pay

## 2024-02-29 ENCOUNTER — Encounter
Admission: RE | Disposition: A | Discharge status: Home / Self Care | Payer: Self-pay | Source: Home / Self Care | Attending: Internal Medicine

## 2024-02-29 ENCOUNTER — Encounter: Payer: Self-pay | Admitting: Internal Medicine

## 2024-02-29 DIAGNOSIS — R0602 Shortness of breath: Secondary | ICD-10-CM | POA: Insufficient documentation

## 2024-02-29 DIAGNOSIS — R079 Chest pain, unspecified: Secondary | ICD-10-CM | POA: Insufficient documentation

## 2024-02-29 DIAGNOSIS — R9439 Abnormal result of other cardiovascular function study: Secondary | ICD-10-CM | POA: Diagnosis present

## 2024-02-29 HISTORY — PX: LEFT HEART CATH AND CORONARY ANGIOGRAPHY: CATH118249

## 2024-02-29 LAB — CARDIAC CATHETERIZATION: Cath EF Quantitative: 60 %

## 2024-02-29 SURGERY — LEFT HEART CATH AND CORONARY ANGIOGRAPHY
Anesthesia: Moderate Sedation | Laterality: Left

## 2024-02-29 MED ORDER — AMLODIPINE BESYLATE 5 MG PO TABS
5.0000 mg | ORAL_TABLET | Freq: Every day | ORAL | Status: DC
  Administered 2024-02-29: 5 mg via ORAL
  Filled 2024-02-29: qty 1

## 2024-02-29 MED ORDER — SODIUM CHLORIDE 0.9% FLUSH: 3.0000 mL | INTRAVENOUS | Status: DC | PRN

## 2024-02-29 MED ORDER — MIDAZOLAM HCL 2 MG/2ML IJ SOLN
INTRAMUSCULAR | Status: DC | PRN
  Administered 2024-02-29: 1 mg via INTRAVENOUS

## 2024-02-29 MED ORDER — HYDRALAZINE HCL 20 MG/ML IJ SOLN: 10.0000 mg | INTRAMUSCULAR | Status: DC | PRN

## 2024-02-29 MED ORDER — IOHEXOL 300 MG/ML  SOLN
INTRAMUSCULAR | Status: DC | PRN
Start: 2024-02-29 — End: 2024-02-29
  Administered 2024-02-29: 85 mL

## 2024-02-29 MED ORDER — HEPARIN (PORCINE) IN NACL 1000-0.9 UT/500ML-% IV SOLN
INTRAVENOUS | Status: DC | PRN
  Administered 2024-02-29: 1000 mL

## 2024-02-29 MED ORDER — LIDOCAINE HCL (PF) 1 % IJ SOLN
INTRAMUSCULAR | Status: AC
  Filled 2024-02-29: qty 30

## 2024-02-29 MED ORDER — HEPARIN (PORCINE) IN NACL 1000-0.9 UT/500ML-% IV SOLN
INTRAVENOUS | Status: AC
Start: 2024-02-29 — End: 2024-02-29
  Filled 2024-02-29: qty 500

## 2024-02-29 MED ORDER — VERAPAMIL HCL 2.5 MG/ML IV SOLN
INTRAVENOUS | Status: AC
  Filled 2024-02-29: qty 2

## 2024-02-29 MED ORDER — SODIUM CHLORIDE 0.9 % WEIGHT BASED INFUSION
3.0000 mL/kg/h | INTRAVENOUS | Status: AC
  Administered 2024-02-29: 3 mL/kg/h via INTRAVENOUS

## 2024-02-29 MED ORDER — SODIUM CHLORIDE 0.9 % IV SOLN: 250.0000 mL | INTRAVENOUS | Status: DC | PRN

## 2024-02-29 MED ORDER — HEPARIN SODIUM (PORCINE) 1000 UNIT/ML IJ SOLN
INTRAMUSCULAR | Status: DC | PRN
  Administered 2024-02-29: 4000 [IU] via INTRAVENOUS

## 2024-02-29 MED ORDER — AMLODIPINE BESYLATE 5 MG PO TABS: 5.0000 mg | ORAL_TABLET | Freq: Every day | ORAL | 0 refills | Status: DC

## 2024-02-29 MED ORDER — FENTANYL CITRATE (PF) 100 MCG/2ML IJ SOLN
INTRAMUSCULAR | Status: DC | PRN
  Administered 2024-02-29: 25 ug via INTRAVENOUS

## 2024-02-29 MED ORDER — SODIUM CHLORIDE 0.9% FLUSH: 3.0000 mL | Freq: Two times a day (BID) | INTRAVENOUS | Status: DC

## 2024-02-29 MED ORDER — SODIUM CHLORIDE 0.9 % WEIGHT BASED INFUSION: 1.0000 mL/kg/h | INTRAVENOUS | Status: DC

## 2024-02-29 MED ORDER — SODIUM CHLORIDE 0.9 % IV SOLN
250.0000 mL | INTRAVENOUS | Status: DC | PRN
Start: 2024-02-29 — End: 2024-02-29

## 2024-02-29 MED ORDER — ISOSORBIDE MONONITRATE ER 30 MG PO TB24: 30.0000 mg | ORAL_TABLET | Freq: Every day | ORAL | 0 refills | Status: DC

## 2024-02-29 MED ORDER — LIDOCAINE HCL (PF) 1 % IJ SOLN
INTRAMUSCULAR | Status: DC | PRN
  Administered 2024-02-29: 2 mL

## 2024-02-29 MED ORDER — HEPARIN SODIUM (PORCINE) 1000 UNIT/ML IJ SOLN
INTRAMUSCULAR | Status: AC
  Filled 2024-02-29: qty 10

## 2024-02-29 MED ORDER — SODIUM CHLORIDE 0.9% FLUSH
3.0000 mL | INTRAVENOUS | Status: DC | PRN
Start: 2024-02-29 — End: 2024-02-29

## 2024-02-29 MED ORDER — MIDAZOLAM HCL 2 MG/2ML IJ SOLN
INTRAMUSCULAR | Status: AC
Start: 2024-02-29 — End: 2024-02-29
  Filled 2024-02-29: qty 2

## 2024-02-29 MED ORDER — ISOSORBIDE MONONITRATE ER 30 MG PO TB24
30.0000 mg | ORAL_TABLET | Freq: Every day | ORAL | Status: DC
  Administered 2024-02-29: 30 mg via ORAL
  Filled 2024-02-29: qty 1

## 2024-02-29 MED ORDER — ACETAMINOPHEN 325 MG PO TABS: 650.0000 mg | ORAL_TABLET | ORAL | Status: DC | PRN

## 2024-02-29 MED ORDER — VERAPAMIL HCL 2.5 MG/ML IV SOLN
INTRAVENOUS | Status: DC | PRN
  Administered 2024-02-29: 2.5 mg via INTRA_ARTERIAL

## 2024-02-29 MED ORDER — ASPIRIN 81 MG PO CHEW
81.0000 mg | CHEWABLE_TABLET | ORAL | Status: AC
  Administered 2024-02-29: 81 mg via ORAL
  Filled 2024-02-29: qty 1

## 2024-02-29 MED ORDER — FENTANYL CITRATE (PF) 100 MCG/2ML IJ SOLN
INTRAMUSCULAR | Status: AC
  Filled 2024-02-29: qty 2

## 2024-02-29 MED ORDER — ONDANSETRON HCL 4 MG/2ML IJ SOLN
4.0000 mg | Freq: Four times a day (QID) | INTRAMUSCULAR | Status: DC | PRN
  Administered 2024-02-29: 4 mg via INTRAVENOUS
  Filled 2024-02-29: qty 2

## 2024-02-29 SURGICAL SUPPLY — 10 items
CATH INFINITI 5 FR JL3.5 (CATHETERS) IMPLANT
CATH INFINITI JR4 5F (CATHETERS) IMPLANT
DEVICE RAD TR BAND REGULAR (VASCULAR PRODUCTS) IMPLANT
DRAPE BRACHIAL (DRAPES) IMPLANT
GLIDESHEATH SLEND SS 6F .021 (SHEATH) IMPLANT
GUIDEWIRE INQWIRE 1.5J.035X260 (WIRE) IMPLANT
PACK CARDIAC CATH (CUSTOM PROCEDURE TRAY) ×1 IMPLANT
PAD ONESTEP ZOLL R SERIES ADT (MISCELLANEOUS) IMPLANT
SET ATX-X65L (MISCELLANEOUS) IMPLANT
STATION PROTECTION PRESSURIZED (MISCELLANEOUS) IMPLANT

## 2024-03-01 ENCOUNTER — Encounter: Payer: Self-pay | Admitting: Internal Medicine

## 2024-05-04 ENCOUNTER — Other Ambulatory Visit (INDEPENDENT_AMBULATORY_CARE_PROVIDER_SITE_OTHER): Payer: Self-pay | Admitting: Vascular Surgery

## 2024-05-04 DIAGNOSIS — I6523 Occlusion and stenosis of bilateral carotid arteries: Secondary | ICD-10-CM

## 2024-05-07 ENCOUNTER — Ambulatory Visit (INDEPENDENT_AMBULATORY_CARE_PROVIDER_SITE_OTHER): Payer: Commercial Managed Care - PPO | Admitting: Vascular Surgery

## 2024-05-07 ENCOUNTER — Ambulatory Visit (INDEPENDENT_AMBULATORY_CARE_PROVIDER_SITE_OTHER): Payer: Commercial Managed Care - PPO

## 2024-05-07 VITALS — BP 134/89 | HR 61 | Wt 226.5 lb

## 2024-05-07 DIAGNOSIS — I6529 Occlusion and stenosis of unspecified carotid artery: Secondary | ICD-10-CM

## 2024-05-07 DIAGNOSIS — K219 Gastro-esophageal reflux disease without esophagitis: Secondary | ICD-10-CM | POA: Diagnosis not present

## 2024-05-07 DIAGNOSIS — I6523 Occlusion and stenosis of bilateral carotid arteries: Secondary | ICD-10-CM

## 2024-05-07 DIAGNOSIS — E782 Mixed hyperlipidemia: Secondary | ICD-10-CM

## 2024-05-12 ENCOUNTER — Encounter (INDEPENDENT_AMBULATORY_CARE_PROVIDER_SITE_OTHER): Payer: Self-pay | Admitting: Vascular Surgery

## 2024-05-12 DIAGNOSIS — K219 Gastro-esophageal reflux disease without esophagitis: Secondary | ICD-10-CM | POA: Insufficient documentation

## 2024-05-12 DIAGNOSIS — E785 Hyperlipidemia, unspecified: Secondary | ICD-10-CM | POA: Insufficient documentation

## 2024-05-12 NOTE — Progress Notes (Signed)
 MRN : [id number]  Trevor Neal is a 63 y.o. (1961-08-03) male who presents with chief complaint of check carotid arteries.  History of Present Illness:   The patient is seen for evaluation of carotid stenosis. The carotid stenosis was identified after an outside ultrasound was obtained secondary to hypertension and hyperlipidemia.  This ultrasound was reported as 50 to 69% stenosis of the right internal carotid and 1 to 39% stenosis of the left  The patient denies amaurosis fugax. There is no recent history of TIA symptoms or focal motor deficits. There is no prior documented CVA.  There is no history of migraine headaches. There is no history of seizures.  The patient is taking enteric-coated aspirin  81 mg daily.  No recent shortening of the patient's walking distance or new symptoms consistent with claudication.  No history of rest pain symptoms. No new ulcers or wounds of the lower extremities have occurred.  There is no history of DVT, PE or superficial thrombophlebitis. No recent episodes of angina or shortness of breath documented.   Duplex ultrasound obtained today in the office here demonstrates 1 to 39% stenosis bilaterally   Current Meds  Medication Sig   aspirin  EC 81 MG tablet Take 81 mg by mouth daily. Swallow whole.   fluticasone (FLONASE) 50 MCG/ACT nasal spray Place 2 sprays into both nostrils daily as needed for allergies.   ibuprofen (ADVIL) 200 MG tablet Take 800 mg by mouth every 6 (six) hours as needed for moderate pain (pain score 4-6) or mild pain (pain score 1-3).   olmesartan (BENICAR) 20 MG tablet Take 20 mg by mouth in the morning.   pantoprazole (PROTONIX) 40 MG tablet Take 40 mg by mouth every evening.   rosuvastatin (CRESTOR) 10 MG tablet Take 10 mg by mouth at bedtime.   triamcinolone cream (KENALOG) 0.1 % Apply 1 Application topically daily as needed (Dry rash).   vitamin B-12 (CYANOCOBALAMIN) 500 MCG tablet Take 500 mcg  by mouth once a week.    Past Medical History:  Diagnosis Date   Cancer (HCC)    basal cell-face   Foot drop    chronic-PT USES BRACE ON RIGHT LEG   Footdrop    chronic   GERD (gastroesophageal reflux disease)    Hyperlipidemia    Hypertension    has not been taking bp meds    Past Surgical History:  Procedure Laterality Date   BACK SURGERY  2008 2012   x5   COLONOSCOPY N/A 01/31/2024   Procedure: COLONOSCOPY;  Surgeon: Maryruth Ole DASEN, MD;  Location: [facility] ENDOSCOPY;  Service: Endoscopy;  Laterality: N/A;   COLONOSCOPY WITH PROPOFOL  N/A 04/04/2015   Procedure: COLONOSCOPY WITH PROPOFOL ;  Surgeon: Lamar DASEN Holmes, MD;  Location: [facility] ENDOSCOPY;  Service: Endoscopy;  Laterality: N/A;   foot drop surgery  2006   LEFT HEART CATH AND CORONARY ANGIOGRAPHY Left 02/29/2024   Procedure: LEFT HEART CATH AND CORONARY ANGIOGRAPHY;  Surgeon: Florencio Cara BIRCH, MD;  Location: [facility] INVASIVE CV LAB;  Service: Cardiovascular;  Laterality: Left;   TOTAL SHOULDER REVISION Right 08/30/2023   Procedure: Right shoulder revision of total shoulder arthroplasty to reverse shoulder arthroplasty;  Surgeon: Tobie Priest, MD;  Location: [facility] ORS;  Service: Orthopedics;  Laterality: Right;   VENTRAL HERNIA REPAIR N/A 03/19/2016   Procedure: HERNIA REPAIR VENTRAL ADULT;  Surgeon: Larinda Unknown Sharps,  MD;  Location: [facility] ORS;  Service: General;  Laterality: N/A;    Social History Social History   Tobacco Use   Smoking status: Never   Smokeless tobacco: Never  Vaping Use   Vaping status: Never Used  Substance Use Topics   Alcohol use: Yes    Alcohol/week: 12.0 standard drinks of alcohol    Types: 12 Cans of beer per week    Comment: weekends only   Drug use: No    Family History No family history on file.  No Known Allergies   REVIEW OF SYSTEMS (Negative unless checked)  Constitutional: [] Weight loss  [] Fever  [] Chills Cardiac: [] Chest pain   [] Chest pressure   [] Palpitations   [] Shortness  of breath when laying flat   [] Shortness of breath with exertion. Vascular:  [x] Pain in legs with walking   [] Pain in legs at rest  [] History of DVT   [] Phlebitis   [] Swelling in legs   [] Varicose veins   [] Non-healing ulcers Pulmonary:   [] Uses home oxygen   [] Productive cough   [] Hemoptysis   [] Wheeze  [] COPD   [] Asthma Neurologic:  [] Dizziness   [] Seizures   [] History of stroke   [] History of TIA  [] Aphasia   [] Vissual changes   [] Weakness or numbness in arm   [] Weakness or numbness in leg Musculoskeletal:   [] Joint swelling   [] Joint pain   [] Low back pain Hematologic:  [] Easy bruising  [] Easy bleeding   [] Hypercoagulable state   [] Anemic Gastrointestinal:  [] Diarrhea   [] Vomiting  [x] Gastroesophageal reflux/heartburn   [] Difficulty swallowing. Genitourinary:  [] Chronic kidney disease   [] Difficult urination  [] Frequent urination   [] Blood in urine Skin:  [] Rashes   [] Ulcers  Psychological:  [] History of anxiety   []  History of major depression.  Physical Examination  Vitals:   05/07/24 1032  BP: 134/89  Pulse: 61  Weight: 226 lb 8 oz (102.7 kg)   Body mass index is 30.72 kg/m. Gen: WD/WN, NAD Head: [place]/AT, No temporalis wasting.  Ear/Nose/Throat: Hearing grossly intact, nares w/o erythema or drainage Eyes: PER, EOMI, sclera nonicteric.  Neck: Supple, no masses.  No bruit or JVD.  Pulmonary:  Good air movement, no audible wheezing, no use of accessory muscles.  Cardiac: RRR, normal S1, S2, no Murmurs. Vascular:  carotid bruit noted Vessel Right Left  Radial Palpable Palpable  Carotid  Palpable  Palpable  Gastrointestinal: soft, non-distended. No guarding/no peritoneal signs.  Musculoskeletal: M/S 5/5 throughout.  No visible deformity.  Neurologic: CN 2-12 intact. Pain and light touch intact in extremities.  Symmetrical.  Speech is fluent. Motor exam as listed above. Psychiatric: Judgment intact, Mood & affect appropriate for pt's clinical situation. Dermatologic: No rashes or  ulcers noted.  No changes consistent with cellulitis.   CBC Lab Results  Component Value Date   WBC 13.8 (H) 09/29/2023   HGB 14.9 09/29/2023   HCT 45.4 09/29/2023   MCV 93.8 09/29/2023   PLT 199 09/29/2023    BMET    Component Value Date/Time   NA 138 08/23/2023 1514   K 3.7 08/23/2023 1514   CL 106 08/23/2023 1514   CO2 25 08/23/2023 1514   GLUCOSE 112 (H) 08/23/2023 1514   BUN 16 08/23/2023 1514   CREATININE 1.01 08/23/2023 1514   CALCIUM 8.9 08/23/2023 1514   GFRNONAA >60 08/23/2023 1514   CrCl cannot be calculated (Patient's most recent lab result is older than the maximum 21 days allowed.).  COAG No results found for: INR, PROTIME  Radiology VAS US  CAROTID Result Date: 05/11/2024 Carotid Arterial Duplex Study Patient Name:  DAISON BRAXTON  Date of Exam:   05/07/2024 Medical Rec #: [id number]            Accession #:    [id number] Date of Birth: [birth year]            Patient Gender: M Patient Age:   68 years Exam Location:  [facility] Procedure:      VAS US  CAROTID Referring Phys: CORDELLA SHAWL --------------------------------------------------------------------------------  Indications:       Carotid artery disease. Comparison Study:  04/07/2022 Performing Technologist: Leafy Gibes RVS  Examination Guidelines: A complete evaluation includes B-mode imaging, spectral Doppler, color Doppler, and power Doppler as needed of all accessible portions of each vessel. Bilateral testing is considered an integral part of a complete examination. Limited examinations for reoccurring indications may be performed as noted.  Right Carotid Findings: +----------+--------+--------+--------+------------------+--------+           PSV cm/sEDV cm/sStenosisPlaque DescriptionComments +----------+--------+--------+--------+------------------+--------+ CCA Prox  122     16                                          +----------+--------+--------+--------+------------------+--------+ CCA Mid   87      16                                         +----------+--------+--------+--------+------------------+--------+ CCA Distal75      20                                         +----------+--------+--------+--------+------------------+--------+ ICA Prox  71      24                                         +----------+--------+--------+--------+------------------+--------+ ICA Mid   90      29                                         +----------+--------+--------+--------+------------------+--------+ ICA Distal89      32                                         +----------+--------+--------+--------+------------------+--------+ ECA       134     15                                         +----------+--------+--------+--------+------------------+--------+ +----------+--------+-------+--------+-------------------+           PSV cm/sEDV cmsDescribeArm Pressure (mmHG) +----------+--------+-------+--------+-------------------+ [id number]      0                                  +----------+--------+-------+--------+-------------------+ +---------+--------+--+--------+--+ VertebralPSV cm/s61EDV cm/s16 +---------+--------+--+--------+--+  Left Carotid  Findings: +----------+--------+--------+--------+------------------+--------+           PSV cm/sEDV cm/sStenosisPlaque DescriptionComments +----------+--------+--------+--------+------------------+--------+ CCA Prox  137     23                                         +----------+--------+--------+--------+------------------+--------+ CCA Mid   95      16                                         +----------+--------+--------+--------+------------------+--------+ CCA Distal87      19                                         +----------+--------+--------+--------+------------------+--------+ ICA Prox  61      15                                          +----------+--------+--------+--------+------------------+--------+ ICA Mid   70      21                                         +----------+--------+--------+--------+------------------+--------+ ICA Distal75      30                                         +----------+--------+--------+--------+------------------+--------+ ECA       79      12                                         +----------+--------+--------+--------+------------------+--------+ +----------+--------+--------+--------+-------------------+           PSV cm/sEDV cm/sDescribeArm Pressure (mmHG) +----------+--------+--------+--------+-------------------+ [id number]     0                                   +----------+--------+--------+--------+-------------------+ +---------+--------+--+--------+--+ VertebralPSV cm/s53EDV cm/s13 +---------+--------+--+--------+--+   Summary: Right Carotid: Velocities in the right ICA are consistent with a 1-39% stenosis. Left Carotid: Velocities in the left ICA are consistent with a 1-39% stenosis. Vertebrals:  Bilateral vertebral arteries demonstrate antegrade flow. Subclavians: Normal flow hemodynamics were seen in bilateral subclavian              arteries. *See table(s) above for measurements and observations.  Electronically signed by Cordella Shawl MD on 05/11/2024 at 7:09:21 AM.    Final      Assessment/Plan 1. Stenosis of carotid artery, unspecified laterality (Primary) Recommend:  Given the patient's asymptomatic subcritical stenosis no further invasive testing or surgery at this time.  Duplex ultrasound shows <30% stenosis bilaterally which has been unchanged when compared to the previous studies.  Continue antiplatelet therapy as prescribed Continue management of CAD, HTN and Hyperlipidemia Healthy heart diet,  encouraged exercise at least 4 times per week  Given the mild bilateral  carotid stenosis the  patient will follow up PRN.  The patient is told that if symptoms of a TIA should occur then he should go to the ER and I should be notified, as this would change the management course.  The patient/family voices understanding.  2. Mixed hyperlipidemia Continue statin as ordered and reviewed, no changes at this time  3. Gastroesophageal reflux disease without esophagitis Continue PPI as already ordered, this medication has been reviewed and there are no changes at this time.  Avoidence of caffeine and alcohol  Moderate elevation of the head of the bed     Cordella Shawl, MD  05/12/2024 11:51 AM

## 2024-05-23 NOTE — H&P (View-Only) (Signed)
 History of Present Illness Trevor Neal is a 63 year old male with a history of left inguinal hernia who presents with worsening left-sided groin pain. He was referred by Dr. Cleotilde for evaluation of his inguinal hernia.  He has a long-standing history of an inguinal hernia, initially diagnosed years ago. Over the past ten days, he has experienced increasing discomfort and pain localized to the left groin area. The pain is exacerbated by sneezing and is described as significant discomfort when pressure is applied. A noticeable bulge on the left side becomes more prominent with certain activities and tends to reduce when lying down. No pain or bulge is reported on the right side.  He has a history of previous hernia surgery, which involved a 'golf ball size' hernia that was successfully repaired without subsequent issues. He denies any radiation of pain to the leg or abdomen and has not experienced any symptoms suggestive of bowel obstruction or incarceration.  His past surgical history includes multiple surgeries, such as shoulder and back surgeries. He recently underwent shoulder surgery in the spring, which was his third procedure on the shoulder due to complications from falls and a foot drop.  He is currently managing his work schedule to accommodate potential surgical intervention, working two to three days a week.      PAST MEDICAL HISTORY:  Past Medical History:  Diagnosis Date  . Allergy    Seasonal  . Essential hypertension 04/17/2015  . Footdrop    chronic- rightside  . GERD (gastroesophageal reflux disease)   . Hyperlipidemia   . Motion sickness         PAST SURGICAL HISTORY:   Past Surgical History:  Procedure Laterality Date  . foot drop  2006   right foot drop surgery  . SPINE SURGERY  2008   LUMBAR LAM SECOND ONE  . SPINAL FUSION  2012   LUMBAR  . ARTHRODESIS ANTERIOR CERVICLE SPINE N/A 06/22/2013   Procedure: C4-5 C5-6 C6-7 ACDF WITH ALLOGRAFT, TRINITY  AND FUSION;  Surgeon: Maude Lamar Campi, MD;  Location: [facility] OR;  Service: Neurosurgery;  Laterality: N/A;  . ARTHRODESIS ANTERIOR CERVICLE SPINE N/A 06/22/2013   Procedure: ARTHRODESIS ANT INTERBODY INC DISCECTOMY, CERVICAL BELOW C2 EACH ADDL;  Surgeon: Maude Lamar Campi, MD;  Location: [facility] OR;  Service: Neurosurgery;  Laterality: N/A;  . INSTRUMENTATION ANTERIOR SPINE 2 TO 3 VERTEBRAL SEGMENTS N/A 06/22/2013   Procedure: INSTRUMENTATION ANTERIOR SPINE 2 TO 3 VERTEBRAL SEGMENTS;  Surgeon: Maude Lamar Campi, MD;  Location: [facility] OR;  Service: Neurosurgery;  Laterality: N/A;  . INSERTION STRUCTURAL BONE ALLOGRAFT FOR SPINE SURGERY N/A 06/22/2013   Procedure: INSERTION STRUCTURAL BONE ALLOGRAFT FOR SPINE SURGERY;  Surgeon: Maude Lamar Campi, MD;  Location: [facility] OR;  Service: Neurosurgery;  Laterality: N/A;  . COLONOSCOPY  04/04/2015   Dr. FABIENE Holmes @ [facility] - Adenomatous Polyps: CBF 03/2018: ltr 01/05/18 (kj)  . COLONOSCOPY  04/04/2015   Dr. FABIENE Holmes @ [facility] - Tubular Adenomas, rpt 3 yrs per RTE  . HERNIA REPAIR  03/19/2016   [facility] --- Dr Unknown Sharps  . COLONOSCOPY  05/11/2018   PH Adenomatous Polyps: CBF 04/2023  (05/23/2023 Recall letter returned.[email or web address])  . REVISION TOTAL SHOULDER ARTHROPLASTY Right 02/06/2021   Procedure: RIGHT TOTAL SHOULDER REPLACEMENT;  Surgeon: Rexie Franky Mt, MD;  Location: [facility] OR;  Service: Orthopedics;  Laterality: Right;  . REPAIR ROTATOR CUFF TEAR CHRONIC OPEN Right 05/28/2022   Procedure: RIGHT SHOULDER OPEN ROTATOR CUFF REPAIR; ACROMIOPLASTY; DISTAL CLAVICLE EXCISION;  Surgeon: Rexie Franky Mt, MD;  Location: [facility] OR;  Service: Orthopedics;  Laterality: Right;  . CLAVICULECTOMY TOTAL Right 05/28/2022   Procedure: CLAVICULECTOMY; PARTIAL;  Surgeon: Rexie Franky Mt, MD;  Location: [facility] OR;  Service: Orthopedics;  Laterality: Right;  . Right revision shoulder arthroplasty (conversion of anatomic shoulder arthroplasty to reverse shoulder arthroplasty) Right  08/30/2023   Dr. Tobie  . COLON@[email or web address]   01/31/2024   Diverticulosis/IntHem/Rpt86yrs/CTL  . BACK SURGERY     total low back surgery x 4 per patient  . JOINT REPLACEMENT    . SPINE SURGERY     LUMBAR LAM   . VASECTOMY           MEDICATIONS:  Outpatient Encounter Medications as of 05/23/2024  Medication Sig Dispense Refill  . amoxicillin (AMOXIL) 500 MG capsule TAKE 4 CAPSULES 1 HOUR PRIOR DENTAL APPOINTMENT    . aspirin  81 MG EC tablet Take 81 mg by mouth once daily    . cyanocobalamin (VITAMIN B12) 1000 MCG tablet Take 1,000 mcg by mouth once daily    . dilTIAZem (CARDIZEM CD) 180 MG CD capsule Take 1 capsule (180 mg total) by mouth once daily 90 capsule 1  . fluticasone propionate (FLONASE) 50 mcg/actuation nasal spray Place 2 sprays into both nostrils once daily as needed for Rhinitis 16 g 11  . meloxicam (MOBIC) 15 MG tablet Take 1 tablet (15 mg total) by mouth once daily 30 tablet 3  . pantoprazole (PROTONIX) 40 MG DR tablet Take 1 tablet (40 mg total) by mouth at bedtime 90 tablet 3  . rosuvastatin (CRESTOR) 10 MG tablet Take 1 tablet (10 mg total) by mouth once daily 90 tablet 3  . triamcinolone 0.1 % cream Apply topically 2 (two) times daily     No facility-administered encounter medications on file as of 05/23/2024.     ALLERGIES:   Patient has no known allergies.   SOCIAL HISTORY:  Social History   Socioeconomic History  . Marital status: Married  Tobacco Use  . Smoking status: Never    Passive exposure: Never  . Smokeless tobacco: Never  Vaping Use  . Vaping status: Never Used  Substance and Sexual Activity  . Alcohol use: Yes    Alcohol/week: 6.0 standard drinks of alcohol    Types: 6 Cans of beer per week    Comment: SOCIALLY  . Drug use: No  . Sexual activity: Yes    Partners: Female    Birth control/protection: None   Social Drivers of Health   Financial Resource Strain: Low Risk  (05/23/2024)   Overall Financial Resource Strain (CARDIA)   . Difficulty  of Paying Living Expenses: Not very hard  Food Insecurity: No Food Insecurity (05/23/2024)   Hunger Vital Sign   . Worried About Programme researcher, broadcasting/film/video in the Last Year: Never true   . Ran Out of Food in the Last Year: Never true  Transportation Needs: Unmet Transportation Needs (05/23/2024)   PRAPARE - Transportation   . Lack of Transportation (Medical): Yes   . Lack of Transportation (Non-Medical): No    FAMILY HISTORY:  Family History  Problem Relation Name Age of Onset  . Myocardial Infarction (Heart attack) Father Gretta Gander   . Heart disease Father Meldon Hanzlik   . Myocardial Infarction (Heart attack) Other         Uncle     GENERAL REVIEW OF SYSTEMS:   General ROS: negative for - chills, fatigue, fever, weight gain or weight loss Allergy and  Immunology ROS: negative for - hives  Hematological and Lymphatic ROS: negative for - bleeding problems or bruising, negative for palpable nodes Endocrine ROS: negative for - heat or cold intolerance, hair changes Respiratory ROS: negative for - cough, shortness of breath or wheezing Cardiovascular ROS: no chest pain or palpitations GI ROS: negative for nausea, vomiting, abdominal pain, diarrhea, constipation Musculoskeletal ROS: negative for - joint swelling or muscle pain Neurological ROS: negative for - confusion, syncope Dermatological ROS: negative for pruritus and rash  PHYSICAL EXAM:  Vitals:   05/23/24 1559  BP: (!) 154/82  Pulse: 65  .  Ht:182.9 cm (6') Wt:(!) 101.6 kg (224 lb) ADJ:Anib surface area is 2.27 meters squared. Body mass index is 30.38 kg/m.SABRA   GENERAL: Alert, active, oriented x3  HEENT: Pupils equal reactive to light. Extraocular movements are intact. Sclera clear. Palpebral conjunctiva normal red color.Pharynx clear.  NECK: Supple with no palpable mass and no adenopathy.  LUNGS: Sound clear with no rales rhonchi or wheezes.  HEART: Regular rhythm S1 and S2 without murmur.  ABDOMEN: Soft and  depressible, nontender with no palpable mass, no hepatomegaly.  Left inguinal hernia, reducible but tender upon reduction.  EXTREMITIES: Well-developed well-nourished symmetrical with no dependent edema.  NEUROLOGICAL: Awake alert oriented, facial expression symmetrical, moving all extremities.   Assessment & Plan Left inguinal hernia   Chronic left inguinal hernia has been symptomatic for ten days, with pain worsened by sneezing. The hernia is reducible and not incarcerated, indicating a low risk of strangulation.  We discussed about possible bilateral repair if hernia is identified in the right groin.  Schedule laparoscopic hernia repair surgery. He should stop aspirin  five days before surgery. Discuss the possibility of open surgery if excessive scar tissue is encountered during the laparoscopic procedure. Explain post-operative expectations, including potential bruising and swelling, and recommend using ice packs for pain management. Prescribe pain medication post-surgery, advising the use of acetaminophen  or ibuprofen initially and to avoid constipation. Advise against straining activities post-surgery to prevent pressure on the repair site.   Non-recurrent unilateral inguinal hernia without obstruction or gangrene [K40.90]          Patient verbalized understanding, all questions were answered, and were agreeable with the plan outlined above.   Lucas Sjogren, MD  Electronically signed by Lucas Sjogren, MD

## 2024-05-28 ENCOUNTER — Ambulatory Visit: Payer: Self-pay | Admitting: General Surgery

## 2024-05-29 ENCOUNTER — Encounter
Admission: RE | Admit: 2024-05-29 | Discharge: 2024-05-29 | Disposition: A | Discharge status: Home / Self Care | Source: Ambulatory Visit | Attending: General Surgery | Admitting: General Surgery

## 2024-05-29 ENCOUNTER — Other Ambulatory Visit: Payer: Self-pay

## 2024-05-29 HISTORY — DX: Unilateral inguinal hernia, without obstruction or gangrene, not specified as recurrent: K40.90

## 2024-05-29 HISTORY — DX: Pneumonia, unspecified organism: J18.9

## 2024-05-29 HISTORY — DX: Basal cell carcinoma of skin of unspecified parts of face: C44.310

## 2024-05-29 HISTORY — DX: Unspecified osteoarthritis, unspecified site: M19.90

## 2024-05-29 NOTE — Patient Instructions (Addendum)
 Your procedure is scheduled on: 06/04/24 - Monday Report to the Registration Desk on the 1st floor of the [facility]. To find out your arrival time, please call [phone] between 1PM - 3PM on: 06/01/24 -Friday If your arrival time is 6:00 am, do not arrive before that time as the [facility] entrance doors do not open until 6:00 am.  REMEMBER: Instructions that are not followed completely may result in serious medical risk, up to and including death; or upon the discretion of your surgeon and anesthesiologist your surgery may need to be rescheduled.  Do not eat food or drink any liquids after midnight the night before surgery.  No gum chewing or hard candies.  One week prior to surgery: Stop Anti-inflammatories (NSAIDS) such as meloxicam (MOBIC) ,Advil, Aleve, Ibuprofen, Motrin, Naproxen, Naprosyn and Aspirin  based products such as Excedrin, Goody's Powder, BC Powder. You may continue to take Tylenol  if needed for pain up until the day of surgery.  Stop ANY OVER THE COUNTER supplements until after surgery.  ON THE DAY OF SURGERY ONLY TAKE THESE MEDICATIONS WITH SIPS OF WATER :  diltiazem (CARDIZEM CD)    No Alcohol for 24 hours before or after surgery.  No Smoking including e-cigarettes for 24 hours before surgery.  No chewable tobacco products for at least 6 hours before surgery.  No nicotine patches on the day of surgery.  Do not use any recreational drugs for at least a week (preferably 2 weeks) before your surgery.  Please be advised that the combination of cocaine and anesthesia may have negative outcomes, up to and including death. If you test positive for cocaine, your surgery will be cancelled.  On the morning of surgery brush your teeth with toothpaste and water , you may rinse your mouth with mouthwash if you wish. Do not swallow any toothpaste or mouthwash.  Do not wear jewelry, make-up, hairpins, clips or nail polish.  For welded (permanent) jewelry: bracelets,  anklets, waist bands, etc.  Please have this removed prior to surgery.  If it is not removed, there is a chance that hospital personnel will need to cut it off on the day of surgery.  Do not wear lotions, powders, or perfumes.   Do not shave body hair from the neck down 48 hours before surgery.  Contact lenses, hearing aids and dentures may not be worn into surgery.  Do not bring valuables to the hospital. [facility] is not responsible for any missing/lost belongings or valuables.   Notify your doctor if there is any change in your medical condition (cold, fever, infection).  Wear comfortable clothing (specific to your surgery type) to the hospital.  After surgery, you can help prevent lung complications by doing breathing exercises.  Take deep breaths and cough every 1-2 hours. Your doctor may order a device called an Incentive Spirometer to help you take deep breaths.  When coughing or sneezing, hold a pillow firmly against your incision with both hands. This is called "splinting." Doing this helps protect your incision. It also decreases belly discomfort.  If you are being admitted to the hospital overnight, leave your suitcase in the car. After surgery it may be brought to your room.  In case of increased patient census, it may be necessary for you, the patient, to continue your postoperative care in the Same Day Surgery department.  If you are being discharged the day of surgery, you will not be allowed to drive home. You will need a responsible individual to drive you  home and stay with you for 24 hours after surgery.   If you are taking public transportation, you will need to have a responsible individual with you.  Please call the Pre-admissions Testing Dept. at [phone] if you have any questions about these instructions.  Surgery Visitation Policy:  Patients having surgery or a procedure may have two visitors.  Children under the age of 27 must have an adult with them  who is not the patient.  Inpatient Visitation:    Visiting hours are 7 a.m. to 8 p.m. Up to four visitors are allowed at one time in a patient room. The visitors may rotate out with other people during the day.  One visitor age 47 or older may stay with the patient overnight and must be in the room by 8 p.m.   Merchandiser, retail to address health-related social needs:  [email or web address]

## 2024-06-04 ENCOUNTER — Other Ambulatory Visit: Payer: Self-pay

## 2024-06-04 ENCOUNTER — Ambulatory Visit: Payer: Self-pay | Admitting: Urgent Care

## 2024-06-04 ENCOUNTER — Encounter
Admission: RE | Disposition: A | Discharge status: Home / Self Care | Payer: Self-pay | Source: Home / Self Care | Attending: General Surgery

## 2024-06-04 ENCOUNTER — Ambulatory Visit

## 2024-06-04 ENCOUNTER — Encounter: Payer: Self-pay | Admitting: General Surgery

## 2024-06-04 ENCOUNTER — Ambulatory Visit
Admission: RE | Admit: 2024-06-04 | Discharge: 2024-06-04 | Disposition: A | Discharge status: Home / Self Care | Attending: General Surgery | Admitting: General Surgery

## 2024-06-04 DIAGNOSIS — K219 Gastro-esophageal reflux disease without esophagitis: Secondary | ICD-10-CM | POA: Diagnosis not present

## 2024-06-04 DIAGNOSIS — K4020 Bilateral inguinal hernia, without obstruction or gangrene, not specified as recurrent: Secondary | ICD-10-CM | POA: Diagnosis present

## 2024-06-04 DIAGNOSIS — I10 Essential (primary) hypertension: Secondary | ICD-10-CM | POA: Insufficient documentation

## 2024-06-04 HISTORY — PX: REPAIR, HERNIA, INGUINAL, BILATERAL, ROBOT-ASSISTED: SHX7636

## 2024-06-04 SURGERY — REPAIR, HERNIA, INGUINAL, BILATERAL, ROBOT-ASSISTED
Anesthesia: General | Site: Inguinal | Laterality: Bilateral

## 2024-06-04 MED ORDER — HYDROMORPHONE HCL 1 MG/ML IJ SOLN
INTRAMUSCULAR | Status: AC
  Filled 2024-06-04: qty 1

## 2024-06-04 MED ORDER — OXYCODONE HCL 5 MG PO TABS
ORAL_TABLET | ORAL | Status: AC
  Filled 2024-06-04: qty 1

## 2024-06-04 MED ORDER — OXYCODONE HCL 5 MG PO TABS
5.0000 mg | ORAL_TABLET | Freq: Once | ORAL | Status: AC
  Administered 2024-06-04: 5 mg via ORAL

## 2024-06-04 MED ORDER — DEXAMETHASONE SOD PHOSPHATE PF 10 MG/ML IJ SOLN
INTRAMUSCULAR | Status: DC | PRN
  Administered 2024-06-04: 10 mg via INTRAVENOUS

## 2024-06-04 MED ORDER — CHLORHEXIDINE GLUCONATE 0.12 % MT SOLN
15.0000 mL | Freq: Once | OROMUCOSAL | Status: AC
  Administered 2024-06-04: 15 mL via OROMUCOSAL

## 2024-06-04 MED ORDER — CEFAZOLIN SODIUM-DEXTROSE 2-4 GM/100ML-% IV SOLN
INTRAVENOUS | Status: AC
  Filled 2024-06-04: qty 100

## 2024-06-04 MED ORDER — BUPIVACAINE-EPINEPHRINE 0.25% -1:200000 IJ SOLN
INTRAMUSCULAR | Status: DC | PRN
  Administered 2024-06-04: 30 mL

## 2024-06-04 MED ORDER — FENTANYL CITRATE (PF) 100 MCG/2ML IJ SOLN
INTRAMUSCULAR | Status: DC | PRN
  Administered 2024-06-04: 100 ug via INTRAVENOUS

## 2024-06-04 MED ORDER — ONDANSETRON HCL 4 MG/2ML IJ SOLN
INTRAMUSCULAR | Status: AC
  Filled 2024-06-04: qty 2

## 2024-06-04 MED ORDER — LIDOCAINE HCL (CARDIAC) PF 100 MG/5ML IV SOSY
PREFILLED_SYRINGE | INTRAVENOUS | Status: DC | PRN
  Administered 2024-06-04: 80 mg via INTRAVENOUS

## 2024-06-04 MED ORDER — 0.9 % SODIUM CHLORIDE (POUR BTL) OPTIME
TOPICAL | Status: DC | PRN
  Administered 2024-06-04: 500 mL

## 2024-06-04 MED ORDER — PROPOFOL 10 MG/ML IV BOLUS
INTRAVENOUS | Status: DC | PRN
Start: 2024-06-04 — End: 2024-06-04
  Administered 2024-06-04: 200 mg via INTRAVENOUS

## 2024-06-04 MED ORDER — FENTANYL CITRATE (PF) 100 MCG/2ML IJ SOLN
INTRAMUSCULAR | Status: AC
  Filled 2024-06-04: qty 2

## 2024-06-04 MED ORDER — KETOROLAC TROMETHAMINE 30 MG/ML IJ SOLN
INTRAMUSCULAR | Status: AC
  Filled 2024-06-04: qty 1

## 2024-06-04 MED ORDER — HYDROCODONE-ACETAMINOPHEN 5-325 MG PO TABS
1.0000 | ORAL_TABLET | Freq: Four times a day (QID) | ORAL | 0 refills | Status: AC | PRN
  Filled 2024-06-04: qty 12, 3d supply, fill #0

## 2024-06-04 MED ORDER — MIDAZOLAM HCL (PF) 2 MG/2ML IJ SOLN
INTRAMUSCULAR | Status: DC | PRN
  Administered 2024-06-04: 2 mg via INTRAVENOUS

## 2024-06-04 MED ORDER — HYDROMORPHONE HCL 1 MG/ML IJ SOLN
INTRAMUSCULAR | Status: DC | PRN
  Administered 2024-06-04: .5 mg via INTRAVENOUS

## 2024-06-04 MED ORDER — BUPIVACAINE HCL (PF) 0.25 % IJ SOLN
INTRAMUSCULAR | Status: AC
  Filled 2024-06-04: qty 30

## 2024-06-04 MED ORDER — ACETAMINOPHEN 10 MG/ML IV SOLN
1000.0000 mg | Freq: Once | INTRAVENOUS | Status: AC
  Administered 2024-06-04: 1000 mg via INTRAVENOUS

## 2024-06-04 MED ORDER — ONDANSETRON HCL 4 MG/2ML IJ SOLN
INTRAMUSCULAR | Status: DC | PRN
  Administered 2024-06-04: 4 mg via INTRAVENOUS

## 2024-06-04 MED ORDER — ORAL CARE MOUTH RINSE: 15.0000 mL | Freq: Once | OROMUCOSAL | Status: AC

## 2024-06-04 MED ORDER — EPINEPHRINE PF 1 MG/ML IJ SOLN
INTRAMUSCULAR | Status: AC
  Filled 2024-06-04: qty 1

## 2024-06-04 MED ORDER — LIDOCAINE HCL (PF) 2 % IJ SOLN
INTRAMUSCULAR | Status: AC
Start: 2024-06-04 — End: 2024-06-04
  Filled 2024-06-04: qty 5

## 2024-06-04 MED ORDER — ROCURONIUM BROMIDE 10 MG/ML (PF) SYRINGE
PREFILLED_SYRINGE | INTRAVENOUS | Status: AC
  Filled 2024-06-04: qty 10

## 2024-06-04 MED ORDER — CEFAZOLIN SODIUM-DEXTROSE 2-4 GM/100ML-% IV SOLN
2.0000 g | INTRAVENOUS | Status: AC
  Administered 2024-06-04: 2 g via INTRAVENOUS

## 2024-06-04 MED ORDER — ROCURONIUM BROMIDE 100 MG/10ML IV SOLN
INTRAVENOUS | Status: DC | PRN
  Administered 2024-06-04: 60 mg via INTRAVENOUS
  Administered 2024-06-04: 40 mg via INTRAVENOUS

## 2024-06-04 MED ORDER — LACTATED RINGERS IV SOLN
INTRAVENOUS | Status: DC
Start: 2024-06-04 — End: 2024-06-04

## 2024-06-04 MED ORDER — SUGAMMADEX SODIUM 200 MG/2ML IV SOLN
INTRAVENOUS | Status: DC | PRN
  Administered 2024-06-04: 200 mg via INTRAVENOUS

## 2024-06-04 MED ORDER — KETOROLAC TROMETHAMINE 30 MG/ML IJ SOLN
INTRAMUSCULAR | Status: DC | PRN
  Administered 2024-06-04: 30 mg via INTRAVENOUS

## 2024-06-04 MED ORDER — PROPOFOL 10 MG/ML IV BOLUS
INTRAVENOUS | Status: AC
Start: 2024-06-04 — End: 2024-06-04
  Filled 2024-06-04: qty 20

## 2024-06-04 MED ORDER — PHENYLEPHRINE 80 MCG/ML (10ML) SYRINGE FOR IV PUSH (FOR BLOOD PRESSURE SUPPORT)
PREFILLED_SYRINGE | INTRAVENOUS | Status: DC | PRN
  Administered 2024-06-04: 160 ug via INTRAVENOUS

## 2024-06-04 MED ORDER — MIDAZOLAM HCL 2 MG/2ML IJ SOLN
INTRAMUSCULAR | Status: AC
  Filled 2024-06-04: qty 2

## 2024-06-04 MED ORDER — FENTANYL CITRATE (PF) 100 MCG/2ML IJ SOLN
25.0000 ug | INTRAMUSCULAR | Status: DC | PRN
  Administered 2024-06-04: 25 ug via INTRAVENOUS

## 2024-06-04 MED ORDER — ACETAMINOPHEN 10 MG/ML IV SOLN
INTRAVENOUS | Status: AC
Start: 2024-06-04 — End: 2024-06-04
  Filled 2024-06-04: qty 100

## 2024-06-04 MED ORDER — CHLORHEXIDINE GLUCONATE 0.12 % MT SOLN
OROMUCOSAL | Status: AC
Start: 2024-06-04 — End: 2024-06-04
  Filled 2024-06-04: qty 15

## 2024-06-04 MED ORDER — DROPERIDOL 2.5 MG/ML IJ SOLN: 0.6250 mg | Freq: Once | INTRAMUSCULAR | Status: DC | PRN

## 2024-06-04 MED ORDER — EPHEDRINE SULFATE-NACL 50-0.9 MG/10ML-% IV SOSY
PREFILLED_SYRINGE | INTRAVENOUS | Status: DC | PRN
  Administered 2024-06-04: 10 mg via INTRAVENOUS
  Administered 2024-06-04: 5 mg via INTRAVENOUS

## 2024-06-04 SURGICAL SUPPLY — 41 items
BAG PRESSURE INF REUSE 1000 (BAG) IMPLANT
COVER TIP SHEARS 8 DVNC (MISCELLANEOUS) ×1 IMPLANT
COVER WAND RF STERILE (DRAPES) ×1 IMPLANT
DEFOGGER SCOPE WARM SEASHARP (MISCELLANEOUS) ×1 IMPLANT
DERMABOND ADVANCED .7 DNX12 (GAUZE/BANDAGES/DRESSINGS) ×1 IMPLANT
DRAPE ARM DVNC X/XI (DISPOSABLE) ×3 IMPLANT
DRAPE COLUMN DVNC XI (DISPOSABLE) ×1 IMPLANT
ELECTRODE REM PT RTRN 9FT ADLT (ELECTROSURGICAL) ×1 IMPLANT
FORCEPS BPLR FENES DVNC XI (FORCEP) ×1 IMPLANT
GLOVE BIO SURGEON STRL SZ 6.5 (GLOVE) ×2 IMPLANT
GLOVE BIOGEL PI IND STRL 6.5 (GLOVE) ×2 IMPLANT
GLOVE SURG SYN 6.5 PF PI (GLOVE) ×2 IMPLANT
GOWN STRL REUS W/ TWL LRG LVL3 (GOWN DISPOSABLE) ×4 IMPLANT
IRRIGATOR SUCT 8 DISP DVNC XI (IRRIGATION / IRRIGATOR) IMPLANT
IV 0.9% NACL 1000 ML (IV SOLUTION) IMPLANT
IV CATH ANGIO 12GX3 LT BLUE (NEEDLE) IMPLANT
KIT PINK PAD W/HEAD ARM REST (MISCELLANEOUS) ×1 IMPLANT
LABEL OR SOLS (LABEL) IMPLANT
MANIFOLD NEPTUNE II (INSTRUMENTS) ×1 IMPLANT
MESH 3DMAX MID 5X7 LT XLRG (Mesh General) IMPLANT
MESH 3DMAX MID 5X7 RT XLRG (Mesh General) IMPLANT
NDL DRIVE SUT CUT DVNC (INSTRUMENTS) ×1 IMPLANT
NDL HYPO 22X1.5 SAFETY MO (MISCELLANEOUS) ×1 IMPLANT
NDL INSUFFLATION 14GA 120MM (NEEDLE) ×1 IMPLANT
NEEDLE DRIVE SUT CUT DVNC (INSTRUMENTS) ×1 IMPLANT
NEEDLE HYPO 22X1.5 SAFETY MO (MISCELLANEOUS) ×1 IMPLANT
NEEDLE INSUFFLATION 14GA 120MM (NEEDLE) ×1 IMPLANT
OBTURATOR OPTICALSTD 8 DVNC (TROCAR) ×1 IMPLANT
PACK LAP CHOLECYSTECTOMY (MISCELLANEOUS) ×1 IMPLANT
SCISSORS MNPLR CVD DVNC XI (INSTRUMENTS) ×1 IMPLANT
SEAL UNIV 5-12 XI (MISCELLANEOUS) ×3 IMPLANT
SET TUBE SMOKE EVAC HIGH FLOW (TUBING) ×1 IMPLANT
SOLUTION ELECTROSURG ANTI STCK (MISCELLANEOUS) ×1 IMPLANT
SUT STRATA 2-0 23CM CT-2 (SUTURE) ×1 IMPLANT
SUT VIC AB 2-0 SH 27XBRD (SUTURE) ×1 IMPLANT
SUT VIC AB 2-0 UR6 27 (SUTURE) IMPLANT
SUTURE MNCRL 4-0 27XMF (SUTURE) ×1 IMPLANT
TAPE TRANSPORE STRL 2 31045 (GAUZE/BANDAGES/DRESSINGS) IMPLANT
TRAP FLUID SMOKE EVACUATOR (MISCELLANEOUS) ×1 IMPLANT
TRAY FOLEY MTR SLVR 16FR STAT (SET/KITS/TRAYS/PACK) ×1 IMPLANT
WATER STERILE IRR 500ML POUR (IV SOLUTION) ×1 IMPLANT

## 2024-06-04 NOTE — Transfer of Care (Signed)
 Immediate Anesthesia Transfer of Care Note  Patient: Trevor Neal  Procedure(s) Performed: REPAIR, HERNIA, INGUINAL, BILATERAL, ROBOT-ASSISTED (Bilateral: Inguinal)  Patient Location: PACU  Anesthesia Type:General  Level of Consciousness: sedated  Airway & Oxygen Therapy: Patient Spontanous Breathing and Patient connected to face mask oxygen  Post-op Assessment: Report given to RN  Post vital signs: Reviewed and stable  Last Vitals:  Vitals Value Taken Time  BP 142/81 06/04/24 09:38  Temp    Pulse 72 06/04/24 09:41  Resp 30 06/04/24 09:41  SpO2 99 % 06/04/24 09:41  Vitals shown include unfiled device data.  Last Pain:  Vitals:   06/04/24 0622  TempSrc: Temporal  PainSc: 0-No pain         Complications: No notable events documented.

## 2024-06-04 NOTE — Op Note (Addendum)
 Preoperative diagnosis: Bilateral inguinal hernia.   Postoperative diagnosis: Bilateral inguinal hernia.  Procedure: Robotic assisted Laparoscopic Transabdominal preperitoneal laparoscopic (TAPP) repair of Bilateral inguinal hernia.  Anesthesia: GETA  Surgeon: Dr. Cesar Coe  Wound Classification: Clean  Indications:  Patient is a 63 y.o. male developed a symptomatic bilateral inguinal hernia. Repair was indicated.  Findings: 1. Bilateral indirect Inguinal hernia identified 2. Vas deferens and cord structures identified and preserved 3. Bard Extra Large 3D Max MID Anatomical mesh used for repair 4. Adequate hemostasis.            Description of procedure:  The patient was taken to the operating room and the correct side of surgery was verified. The patient was placed supine with right arm tucked at the side. After obtaining adequate anesthesia, the patient's abdomen was prepped and draped in standard sterile fashion. A time-out was completed verifying correct patient, procedure, site, positioning, and implant(s) and/or special equipment prior to beginning this procedure.  An incision was made in a natural skin line above the umbilicus. The fascia was elevated and the Veress needle inserted. Proper position was confirmed by aspiration and saline meniscus test.  The abdomen was insufflated with carbon dioxide to a pressure of 15 mmHg. The patient tolerated insufflation well.  Abdominal cavity was entered using Optiview technique with a millimeter trocar.  No injury was identified.  Another 2 mm trocars were placed lateral to each rectus muscle.  Scissors and bipolar forceps were inserted under direct visualization. At the robotic console, both hernias fixed using the following technique: Transverse peritoneal incision is made about 8 cm superior to the inguinal defect. Medial to the epigastric vessels, the parietal compartment is dissected to visualize the rectus muscle. This is  carried down to the symphysis pubis and the retropubic space is dissected to expose at least 2 cm contralateral to the midline. Cooper's ligament is exposed and cleared at least 2 cm below the ligament to ensure adequate space for the inferior border of the mesh. Hesselbach's triangle is cleared assessing for a direct hernia. Lateral to the epigastric vessels, the dissection is carried out in visceral compartment continuing in the true preperitoneal plane. Indirect hernia sac, was carefully reduced bilateraly and separated from the cord structures with medial retraction and a combination of blunt/sharp dissection and focused cautery. This dissection was continued until the cord structures are "parietalized" completely, allowing for visualization of the reflected peritoneum that is continuous with the line originating 2 cm below Coopers medially and across the psoas muscle in the lateral compartment.  The internal ring was interrogated for a cord lipoma. The cord lipoma was reduced to the retroperitoneum and seated dorsal to the preperitoneal mesh. Having achieved a complete dissection with a critical view of the entire myopectineal orifice on both sides, an XL mesh was then positioned centered at the iliopubic tract with the medial side crossing the midline and the inferior edge positioned 2 cm below Coopers ligament. The lateral aspect of the mesh extended 3-5 cm beyond the lateral edge of the psoas. The mesh is fixated using an interrupted suture placed to the ipsilateral Coopers ligament. A second suture was done at the medial superior aspect of the mesh fixating this to the rectus complex.  The peritoneal flap is closed with running barbed suture. Additional holes in the peritoneum closed with suture. Preperitoneal space gas aspirated to visualize the peritoneum apposed directly against the mesh and ensure no folding, lifting, or buckling of the mesh. Skin is closed, sterile  dressings are applied.  The  patient tolerated the procedure well and was taken to the postanesthesia care unit in stable condition  Specimen: None  Complications: None  Estimated Blood Loss: 5 mL

## 2024-06-04 NOTE — Anesthesia Procedure Notes (Signed)
 Procedure Name: Intubation Date/Time: 06/04/2024 7:36 AM  Performed by: Veronica Alm BROCKS, CRNAPre-anesthesia Checklist: Patient identified, Patient being monitored, Timeout performed, Emergency Drugs available and Suction available Patient Re-evaluated:Patient Re-evaluated prior to induction Oxygen Delivery Method: Circle system utilized Preoxygenation: Pre-oxygenation with 100% oxygen Induction Type: IV induction Ventilation: Mask ventilation without difficulty Laryngoscope Size: McGrath and 4 Grade View: Grade I Tube type: Oral Tube size: 7.5 mm Number of attempts: 1 Airway Equipment and Method: Stylet Placement Confirmation: ETT inserted through vocal cords under direct vision, positive ETCO2 and breath sounds checked- equal and bilateral Secured at: 22 cm Tube secured with: Tape Dental Injury: Teeth and Oropharynx as per pre-operative assessment

## 2024-06-04 NOTE — Interval H&P Note (Signed)
 History and Physical Interval Note:  06/04/2024 7:06 AM  Trevor Neal  has presented today for surgery, with the diagnosis of K40.90 non recurrent unilateral inguinal hernia w/o obstruction or gangrene.  The various methods of treatment have been discussed with the patient and family. After consideration of risks, benefits and other options for treatment, the patient has consented to  Procedure(s): REPAIR, HERNIA, INGUINAL, BILATERAL, ROBOT-ASSISTED (Bilateral) as a surgical intervention.  The patient's history has been reviewed, patient examined, no change in status, stable for surgery.  I have reviewed the patient's chart and labs.  Questions were answered to the patient's satisfaction.     Lucas Sjogren

## 2024-06-04 NOTE — Anesthesia Preprocedure Evaluation (Signed)
 Anesthesia Evaluation  Patient identified by MRN, date of birth, ID band Patient awake    Reviewed: Allergy & Precautions, H&P , NPO status , Patient's Chart, lab work & pertinent test results, reviewed documented beta blocker date and time   Airway Mallampati: II  TM Distance: >3 FB Neck ROM: full    Dental  (+) Dental Advidsory Given, Caps   Pulmonary neg pulmonary ROS   Pulmonary exam normal breath sounds clear to auscultation       Cardiovascular Exercise Tolerance: Good hypertension, (-) angina (-) Past MI and (-) Cardiac Stents Normal cardiovascular exam(-) dysrhythmias (-) Valvular Problems/Murmurs Rhythm:regular Rate:Normal     Neuro/Psych negative neurological ROS  negative psych ROS   GI/Hepatic Neg liver ROS,GERD  ,,  Endo/Other  negative endocrine ROS    Renal/GU negative Renal ROS  negative genitourinary   Musculoskeletal   Abdominal   Peds  Hematology negative hematology ROS (+)   Anesthesia Other Findings Past Medical History: No date: Arthritis No date: Basal cell carcinoma (BCC) of skin of face No date: Foot drop     Comment:  chronic-PT USES BRACE ON RIGHT LEG No date: GERD (gastroesophageal reflux disease) No date: Hyperlipidemia No date: Hypertension No date: Inguinal hernia No date: Pneumonia     Comment:  AS ACHILD   Reproductive/Obstetrics negative OB ROS                              Anesthesia Physical Anesthesia Plan  ASA: 2  Anesthesia Plan: General   Post-op Pain Management:    Induction: Intravenous  PONV Risk Score and Plan: 2 and Ondansetron , Dexamethasone , Midazolam  and Treatment may vary due to age or medical condition  Airway Management Planned: Oral ETT  Additional Equipment:   Intra-op Plan:   Post-operative Plan: Extubation in OR  Informed Consent: I have reviewed the patients History and Physical, chart, labs and discussed the  procedure including the risks, benefits and alternatives for the proposed anesthesia with the patient or authorized representative who has indicated his/her understanding and acceptance.     Dental Advisory Given  Plan Discussed with: Anesthesiologist, CRNA and Surgeon  Anesthesia Plan Comments:         Anesthesia Quick Evaluation

## 2024-06-04 NOTE — Discharge Instructions (Addendum)

## 2024-06-04 NOTE — Anesthesia Postprocedure Evaluation (Signed)
 Anesthesia Post Note  Patient: Trevor Neal  Procedure(s) Performed: REPAIR, HERNIA, INGUINAL, BILATERAL, ROBOT-ASSISTED (Bilateral: Inguinal)  Patient location during evaluation: PACU Anesthesia Type: General Level of consciousness: awake and alert Pain management: pain level controlled Vital Signs Assessment: post-procedure vital signs reviewed and stable Respiratory status: spontaneous breathing, nonlabored ventilation, respiratory function stable and patient connected to nasal cannula oxygen Cardiovascular status: blood pressure returned to baseline and stable Postop Assessment: no apparent nausea or vomiting Anesthetic complications: no   No notable events documented.   Last Vitals:  Vitals:   06/04/24 1030 06/04/24 1044  BP:  (!) 160/85  Pulse: 79 77  Resp: 18 16  Temp:  36.7 C  SpO2: 94% 95%    Last Pain:  Vitals:   06/04/24 1044  TempSrc: Temporal  PainSc: 0-No pain                 Prentice Murphy

## 2024-06-05 ENCOUNTER — Encounter: Payer: Self-pay | Admitting: General Surgery
